# Patient Record
Sex: Male | Born: 1955 | Race: White | Hispanic: No | Smoking: Former smoker
Health system: Southern US, Community
[De-identification: ages and names within clinical notes are randomized; demographics above are authoritative.]

## PROBLEM LIST (undated history)

## (undated) DIAGNOSIS — I2510 Atherosclerotic heart disease of native coronary artery without angina pectoris: Secondary | ICD-10-CM

## (undated) DIAGNOSIS — Z9289 Personal history of other medical treatment: Secondary | ICD-10-CM

## (undated) DIAGNOSIS — R7302 Impaired glucose tolerance (oral): Secondary | ICD-10-CM

## (undated) DIAGNOSIS — E785 Hyperlipidemia, unspecified: Secondary | ICD-10-CM

## (undated) DIAGNOSIS — Z8249 Family history of ischemic heart disease and other diseases of the circulatory system: Secondary | ICD-10-CM

## (undated) HISTORY — DX: Family history of ischemic heart disease and other diseases of the circulatory system: Z82.49

## (undated) HISTORY — PX: OTHER SURGICAL HISTORY: SHX169

## (undated) HISTORY — DX: Atherosclerotic heart disease of native coronary artery without angina pectoris: I25.10

## (undated) HISTORY — PX: PARATHYROIDECTOMY: SHX19

## (undated) HISTORY — DX: Hyperlipidemia, unspecified: E78.5

## (undated) HISTORY — DX: Impaired glucose tolerance (oral): R73.02

## (undated) HISTORY — DX: Personal history of other medical treatment: Z92.89

---

## 1999-12-29 ENCOUNTER — Ambulatory Visit (HOSPITAL_COMMUNITY): Admission: RE | Admit: 1999-12-29 | Discharge: 1999-12-29 | Payer: Self-pay | Admitting: Gastroenterology

## 2000-10-30 ENCOUNTER — Encounter: Payer: Self-pay | Admitting: Neurosurgery

## 2000-10-30 ENCOUNTER — Ambulatory Visit (HOSPITAL_COMMUNITY): Admission: RE | Admit: 2000-10-30 | Discharge: 2000-10-30 | Payer: Self-pay | Admitting: Neurosurgery

## 2001-06-04 HISTORY — PX: TRANSTHORACIC ECHOCARDIOGRAM: SHX275

## 2001-09-02 ENCOUNTER — Encounter: Payer: Self-pay | Admitting: Emergency Medicine

## 2001-09-02 ENCOUNTER — Emergency Department (HOSPITAL_COMMUNITY): Admission: EM | Admit: 2001-09-02 | Discharge: 2001-09-02 | Payer: Self-pay | Admitting: Emergency Medicine

## 2001-10-09 ENCOUNTER — Encounter: Admission: RE | Admit: 2001-10-09 | Discharge: 2001-10-09 | Payer: Self-pay | Admitting: Family Medicine

## 2001-10-09 ENCOUNTER — Encounter: Payer: Self-pay | Admitting: Family Medicine

## 2001-10-31 ENCOUNTER — Ambulatory Visit (HOSPITAL_COMMUNITY): Admission: RE | Admit: 2001-10-31 | Discharge: 2001-10-31 | Payer: Self-pay | Admitting: General Surgery

## 2001-10-31 ENCOUNTER — Encounter: Payer: Self-pay | Admitting: General Surgery

## 2002-01-27 ENCOUNTER — Encounter (INDEPENDENT_AMBULATORY_CARE_PROVIDER_SITE_OTHER): Payer: Self-pay | Admitting: Specialist

## 2002-01-27 ENCOUNTER — Observation Stay (HOSPITAL_COMMUNITY): Admission: RE | Admit: 2002-01-27 | Discharge: 2002-01-28 | Payer: Self-pay | Admitting: General Surgery

## 2004-06-09 ENCOUNTER — Ambulatory Visit: Payer: Self-pay | Admitting: Internal Medicine

## 2004-06-20 ENCOUNTER — Ambulatory Visit: Payer: Self-pay | Admitting: Internal Medicine

## 2004-09-14 ENCOUNTER — Ambulatory Visit (HOSPITAL_COMMUNITY): Admission: RE | Admit: 2004-09-14 | Discharge: 2004-09-14 | Payer: Self-pay | Admitting: Gastroenterology

## 2004-10-02 HISTORY — PX: APPENDECTOMY: SHX54

## 2004-10-23 ENCOUNTER — Encounter (INDEPENDENT_AMBULATORY_CARE_PROVIDER_SITE_OTHER): Payer: Self-pay | Admitting: Specialist

## 2004-10-23 ENCOUNTER — Inpatient Hospital Stay (HOSPITAL_COMMUNITY): Admission: RE | Admit: 2004-10-23 | Discharge: 2004-10-26 | Payer: Self-pay | Admitting: General Surgery

## 2005-03-20 ENCOUNTER — Ambulatory Visit: Payer: Self-pay | Admitting: Internal Medicine

## 2006-04-23 ENCOUNTER — Ambulatory Visit: Payer: Self-pay | Admitting: Internal Medicine

## 2006-05-13 ENCOUNTER — Ambulatory Visit: Payer: Self-pay | Admitting: Internal Medicine

## 2007-05-05 DIAGNOSIS — Z9289 Personal history of other medical treatment: Secondary | ICD-10-CM

## 2007-05-05 HISTORY — DX: Personal history of other medical treatment: Z92.89

## 2007-07-21 ENCOUNTER — Encounter: Admission: RE | Admit: 2007-07-21 | Discharge: 2007-07-21 | Payer: Self-pay | Admitting: Gastroenterology

## 2007-07-24 ENCOUNTER — Ambulatory Visit: Payer: Self-pay | Admitting: Internal Medicine

## 2007-07-24 DIAGNOSIS — J4520 Mild intermittent asthma, uncomplicated: Secondary | ICD-10-CM

## 2007-07-24 DIAGNOSIS — J0100 Acute maxillary sinusitis, unspecified: Secondary | ICD-10-CM

## 2007-07-24 DIAGNOSIS — J069 Acute upper respiratory infection, unspecified: Secondary | ICD-10-CM | POA: Insufficient documentation

## 2008-06-21 ENCOUNTER — Telehealth (INDEPENDENT_AMBULATORY_CARE_PROVIDER_SITE_OTHER): Payer: Self-pay | Admitting: *Deleted

## 2009-03-28 ENCOUNTER — Ambulatory Visit: Payer: Self-pay | Admitting: Internal Medicine

## 2010-08-18 ENCOUNTER — Telehealth (INDEPENDENT_AMBULATORY_CARE_PROVIDER_SITE_OTHER): Payer: Self-pay | Admitting: *Deleted

## 2010-08-22 NOTE — Progress Notes (Signed)
Summary: would like tamiflu called rx sent  Phone Note Call from Patient   Caller: Patient Call For: young Summary of Call: patient phoned stated his 56 year old daughter and she has the flu and they gave tamiflu to his other daughter, and he wants to know if we would call in Tamiflu for him as a precaution. He uses [facility] in [place]. He can be reached at [phone] Initial call taken by: Vedia Coffer,  August 18, 2010 10:31 AM  Follow-up for Phone Call        Called and spoke with pt and he states his daughter has been dx with the flu and she was prescribed tamiflu. Pt states he would like this called in so he can have on hand. Pt states he is not having any symptoms. Also pt has not been seen since 03/2009. Please Advise Dr. Maple Hudson. Thanks.   Allergies  No Known Drug Allergies  Additional Follow-up for Phone Call Additional follow up Details #1::        Per CDY-okay to give Tamilfu 75mg  #10  take 1 by mouth two times a day x 5 days no refills.Reynaldo Minium CMA  August 18, 2010 12:06 PM   called and informed pt rx was sent to pharmacy. he verbalized understanding Carver Fila  August 18, 2010 12:09 PM     New/Updated Medications: TAMIFLU 75 MG CAPS (OSELTAMIVIR PHOSPHATE) take 1 tablet two times a day x 5 days Prescriptions: TAMIFLU 75 MG CAPS (OSELTAMIVIR PHOSPHATE) take 1 tablet two times a day x 5 days  #10 x 0   Entered by:   Carver Fila   Authorized by:   Waymon Budge MD   Signed by:   Carver Fila on 08/18/2010   Method used:   [facility]  [address] [id number]       [address]       [place], [place]  [address]       Ph: [phone] or [phone]       Fax: [phone]   RxID:   [id number]

## 2010-10-20 NOTE — Op Note (Signed)
Craig Townsend, Craig Townsend NO.:  [id number]   MEDICAL RECORD NO.:  [id number]          PATIENT TYPE:  INP   LOCATION:  X007                         FACILITY:  [facility]   PHYSICIAN:  Gita Kudo, M.D. DATE OF BIRTH:  08-Sep-1955   DATE OF PROCEDURE:  10/23/2004  DATE OF DISCHARGE:                                 OPERATIVE REPORT   OPERATIVE PROCEDURE:  1.  Exploratory laparotomy.  2.  Resection of Meckel's diverticulum.  3.  Incidental appendectomy.   ASSISTANT:  Angelia Mould. Derrell Lolling, M.D.   ANESTHESIA:  General endotracheal.   PREOPERATIVE DIAGNOSIS:  Small bowel lipoma.   POSTOPERATIVE DIAGNOSES:  1.  Intussuscepting Meckel's diverticulum.  2.  Normal-appearing thin long appendix.   CLINICAL SUMMARY:  This 55 year old gentleman has had bouts of abdominal  pain.  He had GI work-up and CAT scan at GI work-up and also a CAT scan at  Dr. Ethelene Hal' office.  Dr. Kinnie Scales did a normal colonoscopy.  CAT scan showed a  possible lesion in the small bowel.  A small bowel series showed a probable  ileal lipoma approximately 4 to 5 cm in size.  His symptoms were primarily  crampy type mid abdominal pain near the umbilicus.   OPERATIVE FINDINGS:  The patient had a dimpling of the distal ileum and I  could feel something in the lumen.  We were able to withdraw the  Intussuscepting Meckel's as well as a distal lipoma of the Meckel's.  The  appendix was small.  The entire small bowel was run and there was no other  abnormality.   OPERATIVE PROCEDURE:  Under satisfactory general endotracheal anesthesia,  having received heparin and antibiotics preoperatively, he was positioned,  prepped and draped in the standard fashion.  Nasogastric tube had been  placed.  Midline incision was made with hemostasis intact by cautery.  Self-  retaining retractors were placed and manual and visual laparotomy performed.  The entire small bowel was normal except for the area mentioned above.  The  large bowel also felt normal.  The liver felt normal; although it was not  visualized.   Then, the area of the small bowel was isolated, and with pressure, we  reduced the Meckel's and withdrew it out of the invagination that it had  done and there was a large lipoma at the tip.  It was felt best that we  would just resect the Meckel's and not have to do a formal small bowel  resection.  First, I did an incidental appendectomy.  The long narrow  retrocecal appendix was followed to its tip and then dissected away with  cautery.  At the base, it was secured with a 2-0 silk tie and a 3-0 silk  purse-string placed around it.  It was then clamped, cut, and reduced into  the purse-string and tied.  The area was hemostatic and there was no  spillage.   Then, the Meckel's was elevated.  Traction sutures were placed on either  side of it.  The GIA stapler was then fired across the base in a direction  perpendicular  to the long axis of the bowel to avoid narrowing.  The stapled  closure was then oversewn with interrupted 3-0 silk suture.  The lumen was  not at all compromised.  The specimen was sent to pathology.  The wound  lavaged with saline.  Following this, the wound was closed with a running  midline suture of #1 PDS.  The subcu was infiltrated with 30 cc of Marcaine  for postop analgesia.  Skin edges approximated with staples.  There were no  complications.  The sponge and needle counts were correct.  A sterile  dressing applied and he went to the recovery room.  Blood loss was  negligible.      MRL/MEDQ  D:  10/23/2004  T:  10/23/2004  Job:  [id number]   cc:   Donia Guiles, M.D.  [address] [place]  [place] [address]  Fax: [phone]   Griffith Citron, M.D.  [facility] [place]  [place] [address]  Fax: [phone]   Jamison Neighbor, M.D.  [address]  [place]  [place] [address]  Fax: [phone]   Angelia Mould. Derrell Lolling, M.D.  [address]  [place]  [place] [address]

## 2010-10-20 NOTE — Op Note (Signed)
NAME:  Craig Townsend, Craig Townsend NO.:  [id number]   MEDICAL RECORD NO.:  [id number]                   PATIENT TYPE:   LOCATION:                                       FACILITY:   PHYSICIAN:  Gita Kudo, M.D.              DATE OF BIRTH:   DATE OF PROCEDURE:  01/27/2002  DATE OF DISCHARGE:                                 OPERATIVE REPORT   PREOPERATIVE DIAGNOSES:  Parathyroid adenoma--left lower.   POSTOPERATIVE DIAGNOSES:  Parathyroid adenoma--left lower, confirmed by  frozen section.   OPERATION PERFORMED:  Minimally invasive resection, parathyroid adenoma,  left lower.   SURGEON:  Gita Kudo, M.D.   ASSISTANT:  Velora Heckler, M.D.   ANESTHESIA:  General.   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  This remained elevated and a PTH was  obtained which was elevated and then I saw the patient in ordered a  sestamibi scan.  This was consistent with a left lower parathyroid adenoma.   OPERATIVE FINDINGS:  The patient had a large parathyroid adenoma in the left  lower position.  It was hot on Neoprobe scanning.  After its removal, the  background returned to normal.   DESCRIPTION OF PROCEDURE:  Under satisfactory general endotracheal  anesthesia, the patient was prepped and positioned in the standard fashion.  Preoperative Neoprobe was performed and marking made over the area of hot  nodule in the left lower neck.  Then a transverse incision was made from the  midline to the sternomastoid muscle and platysma incised and small flaps  developed.  The strap muscles were identified at the midline and dissected  and then retracted medially.  The lower pole of the thyroid was evident and  below it there was a rounded mass consistent with a parathyroid adenoma.  This was gently dissected free from the  thyroid and bleeders were divided  after either tying or placing metal clips.  The specimen was then removed  and sent to the pathologist.  In scanning outside the neck, the specimen was  quite hot.  The background of the neck was consistent in all areas.  Following this, the wound was lavaged in saline and closed in layers with 3-  0 and 4-0 Vicryl and Steri-Strips for  skin.  The pathologist called back and frozen section showed that the  specimen was consistent with parathyroid adenoma.  The patient was then  transferred to the recovery room in good condition.  Sponge and needle  counts were correct.  Gita Kudo, M.D.    MRL/MEDQ  D:  01/27/2002  T:  01/29/2002  Job:  [id number]   cc:   Desma Maxim, M.D.

## 2010-10-20 NOTE — Procedures (Signed)
[facility]  Patient:    Craig Townsend, Craig Townsend                      MRN: [id number] Proc. Date: 12/29/99 Adm. Date:  [id number] Disc. Date: [id number] Attending:  Deneen Harts CC:         Desma Maxim, M.D.                           Procedure Report  PROCEDURE:  Colonoscopy.  INDICATION:  55 year old white male undergoing colonoscopy at five year interval for neoplasia surveillance.  History of adenomatous colon polyp initially diagnosed 73.  Repeat examination 1990, 1996.  The patient remains asymptomatic.  DESCRIPTION OF PROCEDURE:  After reviewing the nature of the procedure with the patient including potential risks and complications, and after discussing alternative methods of diagnosis and treatment, informed consent was signed.  The patient was premedicated receiving IV sedation totalling Versed 7.5 mg, Fentanyl 75 mcg administered IV in divided doses prior to and during the course of the procedure.  Using an Olympus PCF-140L pediatric video colonoscope, rectum was intubated after normal digital examination.  The scope was inserted and advanced without difficulty around the entire length of the colon to the cecum which was identified by the appendiceal orifice and ileocecal valve.  Excellent preparation throughout, patient having taken a combination Visicol/Fleets Phospho-Soda preparation.  The scope was slowly withdrawn with careful inspection of the entire colon in a retrograde manner including retroflex view in the rectal vault.  No abnormality was noted.  There was no evidence of neoplasia, mucosal inflammation, diverticular disease, or vascular lesion. Retroflex view in the vault revealed hypertrophy at anal papilla but no additional abnormality appreciated.  The colon was decompressed and scope withdrawn.  The patient tolerated the procedure without difficulty being maintained on Datascope monitor and low-flow oxygen throughout.   Returned to recovery in stable condition.  Time 1, technical 1, preparation 1, total score = 3.  ASSESSMENT:  Normal colonoscopy without evidence of recurrent colorectal neoplasia.  RECOMMENDATIONS: 1. Annual Hemoccults per Dr. Arvilla Market. 2. Repeat colonoscopy five years - per Dr. Kinnie Scales. DD:  12/29/99 TD:  01/01/00 Job: [id number]

## 2010-10-20 NOTE — Assessment & Plan Note (Signed)
[facility]                               PULMONARY OFFICE NOTE   ELNATHAN, FULFORD                      MRN:          [id number]  DATE:04/23/2006                            DOB:          May 07, 1956    PULMONARY FOLLOWUP   PROBLEM:  1. Asthma.  2. Allergic rhinitis.   HISTORY:  He says this has been a good year but, 10 days ago, his daughter  brought home a cold.  He has begun sore throat and a progressive chest  congestion.  He saw a physician assistant at Dr. Roselie Skinner office and was  given [employer].  Now nasal discharge is green.  He is noticing  paroxysmal cough and some wheeze, frontal and vertex headache.  No fever.  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  When he was last seen a year ago, we had  given him amoxicillin and he says that worked well.  A chest x-ray at that  visit showed no evidence of chest disease.  I talked with him today about  viral versus bacterial infection, supportive care of simple infections,  realistic expectations.  He is convinced that he is a little different and  that left alone he will rapidly get worse.   MEDICATIONS:  P.R.N. use of Allegra 60 mg, Celebrex, and albuterol inhaler  with no medication allergy.   OBJECTIVE:  Weight 180 pounds, BP 132/84, pulse regular 85, room air  saturation 98%.  A very red throat, no adenopathy, marked nasal congestion with a little air  flow through it.  There is no stridor.  His chest sounds quiet and clear, except that with a deep breath, he began  coughing, nonproductively.   IMPRESSION:  Acute respiratory syndrome consistent with a viral upper  respiratory infection and some tracheitis.   PLAN:  1. We discussed supportive care, fluids, rest, and symptomatic therapy.  2. Refill prescriptions albuterol and Allegra 60 mg.  3. Phenergan with codeine 5 mL q. 6 h. p.r.n. occasional use.  4.  After discussion, I gave a prescription to hold for amoxicillin 500 mg      t.i.d. x7 days for the holidays while the office will be closed.  5. Schedule return in 1 year, earlier p.r.n.    Clinton D. Maple Hudson, MD, Tonny Bollman, FACP  Electronically Signed   CDY/MedQ  DD: 04/24/2006  DT: 04/25/2006  Job #: [id number]

## 2010-10-20 NOTE — Assessment & Plan Note (Signed)
[facility]                             PULMONARY OFFICE NOTE   ELMAR, ANTIGUA                      MRN:          [id number]  DATE:05/13/2006                            DOB:          [birth year]    HISTORY:  The patient is a 55 year old white male, a patient of Dr.  Joni Fears D[facility] who has a known history of asthma and allergic  rhinitis.  He presents for an acute office visit.  The patient complains  of a three-week history of persistent nasal congestion, sinus pain and  pressure and cough.  The patient was seen approximately three weeks ago  for similar symptoms and given amoxicillin x7 days.  The patient reports  symptoms only minimally improved, and continues to have thick green  nasal discharge and sinus pain and pressure.  The patient denies any  hemoptysis, chest pain or shortness of breath.   PAST MEDICAL HISTORY:  Is reviewed.   CURRENT MEDICATIONS:  Are reviewed.   PHYSICAL EXAMINATION:  GENERAL:  The patient is a pleasant white male,  in no acute distress.  VITAL SIGNS:  Afebrile with stable vital signs.  HEENT:  Nasal mucosa erythematous.  Maxillary sinus tenderness to  percussion.  Posterior pharynx is clear.  NECK:  Supple.  LUNGS:  Sounds clear.  HEART:  A regular rate.  ABDOMEN:  Soft, benign.  EXTREMITIES:  Warm without any edema.   IMPRESSION/PLAN:  Acute sinusitis:  The patient is to begin Omnicef x10  days.  Mucinex DM  b.i.d.  Nasal __________  daily and Afrin nasal x5  days.  The patient is to return back with Dr. Maple Hudson as scheduled, or  sooner if needed.      Rubye Oaks, NP  Electronically Signed      Clinton D. Maple Hudson, MD, Tonny Bollman, FACP  Electronically Signed   TP/MedQ  DD: 05/13/2006  DT: 05/14/2006  Job #: [id number]

## 2010-10-20 NOTE — Discharge Summary (Signed)
NAMEDESHUN, SEDIVY NO.:  [id number]   MEDICAL RECORD NO.:  [id number]          PATIENT TYPE:  INP   LOCATION:  1511                         FACILITY:  [facility]   PHYSICIAN:  Gita Kudo, M.D. DATE OF BIRTH:  02-Nov-1955   DATE OF ADMISSION:  10/23/2004  DATE OF DISCHARGE:  10/26/2004                                 DISCHARGE SUMMARY   CHIEF COMPLAINT:  Abdominal pain, small bowel lesion.   HISTORY OF PRESENT ILLNESS:  This 55 year old male was admitted for elective  surgery. He has had crampy abdominal pain and workup shows a small bowel  lesion. He has had a previous parathyroid adenoma excised by myself.   LABORATORY STUDIES:  Pathology:  Meckel's diverticulum with inflammation and  ulceration. Appendix with fibrous obliteration. Hemoglobin 15, hematocrit  45, white count 5700. CMET normal except for random elevated sugar of 112.  Bilirubin minimally elevated at 1.3. EKG was interpreted as normal.   HOSPITAL COURSE:  On the morning of admission the patient underwent  laparotomy with resection of a Meckel's diverticulum and incidental  appendectomy. Of note is the fact that the Meckel's had actually  intussuscepted.   Postoperatively he had a benign course. His nasogastric tube was removed, he  was tolerating liquids, and he improved to the point where he was discharged  from the hospital on his third postoperative day.   There were no complications. No consultations.   CONDITION ON DISCHARGE:  Good.   FINAL DIAGNOSIS:  Meckel's diverticulum and incidental appendectomy.       MRL/MEDQ  D:  11/10/2004  T:  11/10/2004  Job:  [id number]   cc:   Griffith Citron, M.D.  [facility] [place]  [place] [address]  Fax: [phone]   Donia Guiles, M.D.  [address] [place]  [place] [address]  Fax: [phone]   Jamison Neighbor, M.D.  [address]  [place]  [place] [address]  Fax: [phone]

## 2010-11-13 ENCOUNTER — Encounter: Payer: Self-pay | Admitting: Internal Medicine

## 2010-11-14 ENCOUNTER — Other Ambulatory Visit (INDEPENDENT_AMBULATORY_CARE_PROVIDER_SITE_OTHER): Payer: 59

## 2010-11-14 ENCOUNTER — Encounter: Payer: Self-pay | Admitting: Internal Medicine

## 2010-11-14 ENCOUNTER — Ambulatory Visit (INDEPENDENT_AMBULATORY_CARE_PROVIDER_SITE_OTHER): Payer: 59 | Admitting: Internal Medicine

## 2010-11-14 VITALS — BP 124/78 | HR 77 | Ht 71.0 in | Wt 190.4 lb

## 2010-11-14 DIAGNOSIS — J019 Acute sinusitis, unspecified: Secondary | ICD-10-CM

## 2010-11-14 DIAGNOSIS — J45909 Unspecified asthma, uncomplicated: Secondary | ICD-10-CM

## 2010-11-14 LAB — CBC WITH DIFFERENTIAL/PLATELET
Eosinophils Relative: 2.5 % (ref 0.0–5.0)
HCT: 43.7 % (ref 39.0–52.0)
Lymphs Abs: 1.8 10*3/uL (ref 0.7–4.0)
MCV: 91.4 fl (ref 78.0–100.0)
Monocytes Absolute: 0.4 10*3/uL (ref 0.1–1.0)
Neutro Abs: 4.1 10*3/uL (ref 1.4–7.7)
Platelets: 197 10*3/uL (ref 150.0–400.0)
WBC: 6.5 10*3/uL (ref 4.5–10.5)

## 2010-11-14 LAB — SEDIMENTATION RATE: Sed Rate: 7 mm/hr (ref 0–22)

## 2010-11-14 MED ORDER — METHYLPREDNISOLONE ACETATE 80 MG/ML IJ SUSP
80.0000 mg | Freq: Once | INTRAMUSCULAR | Status: AC
  Administered 2010-11-14: 80 mg via INTRAMUSCULAR

## 2010-11-14 NOTE — Assessment & Plan Note (Addendum)
I think an allergic rhinitis is more likely than overt  sinusitis now. There may be additional basis for morning headache.

## 2010-11-14 NOTE — Assessment & Plan Note (Signed)
Not clear by history  But he seems to have had very mild exacerbation of asthma and rhintis We will give depo x 1 and send lab addressing his questions about allergy and general malaise. Reassurance.

## 2010-11-14 NOTE — Patient Instructions (Addendum)
Depo 80  Lab-  CBC w/ diff, Sed rate, TSH, Allergy profile        Dx asthma, sinusitis

## 2010-11-14 NOTE — Progress Notes (Signed)
  Subjective:    Patient ID: Craig Townsend, male    DOB: 05/13/1956, 55 y.o.   MRN: [id number]  HPI 11/14/10- 73 yoM never smoker, followed for rhinitis, hx asthma, URI and sinusitis. Last here 03/28/09- Note reviewed. Has avoided colds better in last few years- still has 67 and 61 yo children bringing colds home.  In last 2 months is waking more with headache and wants to see if if is allergy.  Vaguely anterior headaches, more often early morning. Some dizziness. Ears ok. Nose ok. Throat gets uncomfortable, not really sore. Denies fever, sweat, swollen glands. Chest ok and denies rest of ROS. Some good days.  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 Sleeps better some days than others.   Review of Systems Constitutional:   No weight loss, night sweats,  Fevers, chills, fatigue, lassitude. HEENT:   No ,  Difficulty swallowing,  Tooth/dental problems,  Sore throat,                No sneezing, itching,    CV:  No chest pain,  Orthopnea, PND, swelling in lower extremities, anasarca, dizziness, palpitations  GI  No heartburn, indigestion, abdominal pain, nausea, vomiting, diarrhea, change in bowel habits, loss of appetite  Resp: No shortness of breath with exertion or at rest.  No excess mucus, no productive cough,  No non-productive cough,  No coughing up of blood.  No change in color of mucus.  No wheezing.  Skin: no rash or lesions.  GU: no dysuria, change in color of urine, no urgency or frequency.  No flank pain.  MS:  No joint pain or swelling.  No decreased range of motion.  No back pain.  Psych:  No change in mood or affect. No depression or anxiety.  No memory loss.      Objective:   Physical Exam General- Alert, Oriented, Affect-appropriate, Distress- none acute  Skin- rash-none, lesions- none, excoriation- none  Lymphadenopathy- none  Head- atraumatic  Eyes- Gross vision intact, PERRLA, conjunctivae clear secretions  Ears- Hearing,  canals, Tm- normal  Nose- Clear, No- Septal dev, mucus, polyps, erosion, perforation   Throat- Mallampati II , mucosa clear , drainage- none, tonsils- atrophic  Neck- flexible , trachea midline, no stridor , thyroid nl, carotid no bruit  Chest - symmetrical excursion , unlabored     Heart/CV- RRR , no murmur , no gallop  , no rub, nl s1 s2                     - JVD- none , edema- none, stasis changes- none, varices- none     Lung- clear to P&A, wheeze- none, cough- none , dullness-none, rub- none     Chest wall-   Abd- tender-no, distended-no, bowel sounds-present, HSM- no  Br/ Gen/ Rectal- Not done, not indicated  Extrem- cyanosis- none, clubbing, none, atrophy- none, strength- nl  Neuro- grossly intact to observation        Assessment & Plan:

## 2010-11-15 LAB — ALLERGY FULL PROFILE
Allergen,Goose feathers, e70: <0.35 kU/L (ref <0.35)
Alternaria Alternata: <0.35 kU/L (ref <0.35)
Bermuda Grass: <0.35 kU/L (ref <0.35)
Candida Albicans: <0.35 kU/L (ref <0.35)
Cat Dander: <0.35 kU/L (ref <0.35)
Curvularia lunata: <0.35 kU/L (ref <0.35)
Dog Dander: <0.35 kU/L (ref <0.35)
Fescue: <0.35 kU/L (ref <0.35)
G009 Red Top: <0.35 kU/L (ref <0.35)
Goldenrod: <0.35 kU/L (ref <0.35)
Helminthosporium halodes: <0.35 kU/L (ref <0.35)
IgE (Immunoglobulin E), Serum: 6.7 IU/mL (ref 0.0–180.0)
Lamb's Quarters: <0.35 kU/L (ref <0.35)
Plantain: <0.35 kU/L (ref <0.35)
Stemphylium Botryosum: <0.35 kU/L (ref <0.35)

## 2010-11-18 ENCOUNTER — Encounter: Payer: Self-pay | Admitting: Internal Medicine

## 2010-11-27 NOTE — Progress Notes (Signed)
Quick Note:  Spoke with patient-aware of results. ______ 

## 2011-06-14 ENCOUNTER — Other Ambulatory Visit: Payer: Self-pay | Admitting: Urology

## 2011-06-14 DIAGNOSIS — R109 Unspecified abdominal pain: Secondary | ICD-10-CM

## 2011-06-15 ENCOUNTER — Other Ambulatory Visit: Payer: 59

## 2011-06-19 ENCOUNTER — Ambulatory Visit
Admission: RE | Admit: 2011-06-19 | Discharge: 2011-06-19 | Disposition: A | Discharge status: Home / Self Care | Payer: 59 | Source: Ambulatory Visit | Attending: Urology | Admitting: Urology

## 2011-06-19 DIAGNOSIS — R109 Unspecified abdominal pain: Secondary | ICD-10-CM

## 2012-05-11 IMAGING — US US RENAL
1 series · 14 of 25 positions shown · non-contrast
Comparison: 07/21/2007

CLINICAL DATA: Left flank pain.

RENAL/URINARY TRACT ULTRASOUND COMPLETE

[Series 1: us renal · 0.22mm/px · 14 of 30 slices shown]
[im 1/30]
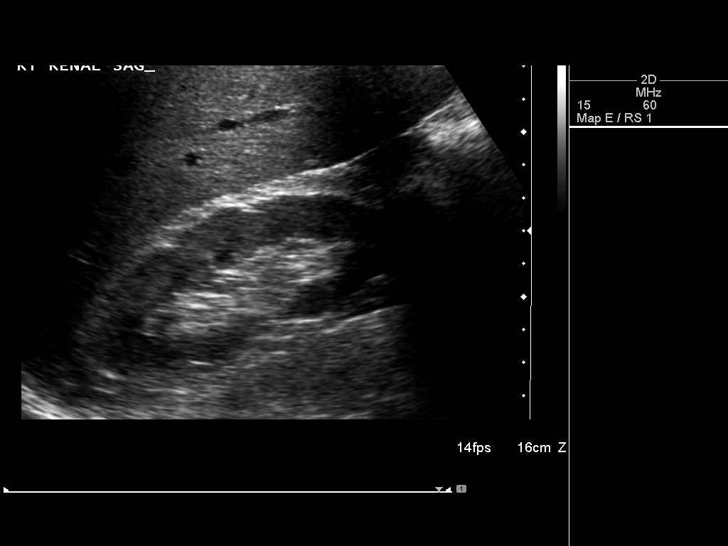
[im 3/30]
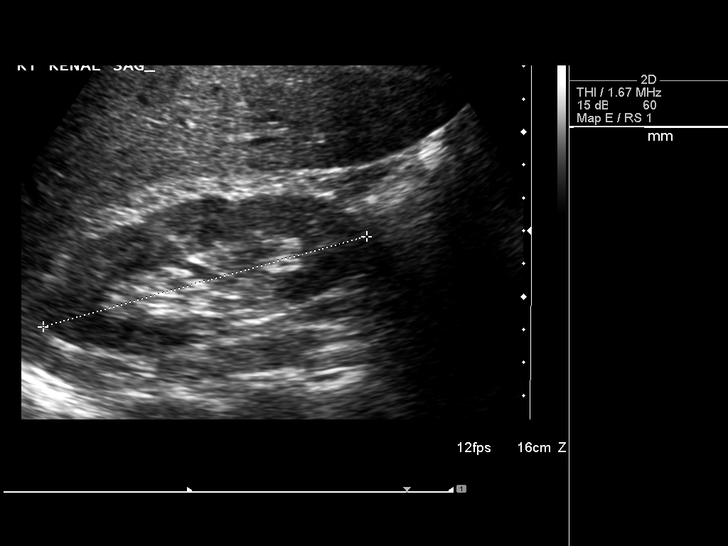
[im 5/30]
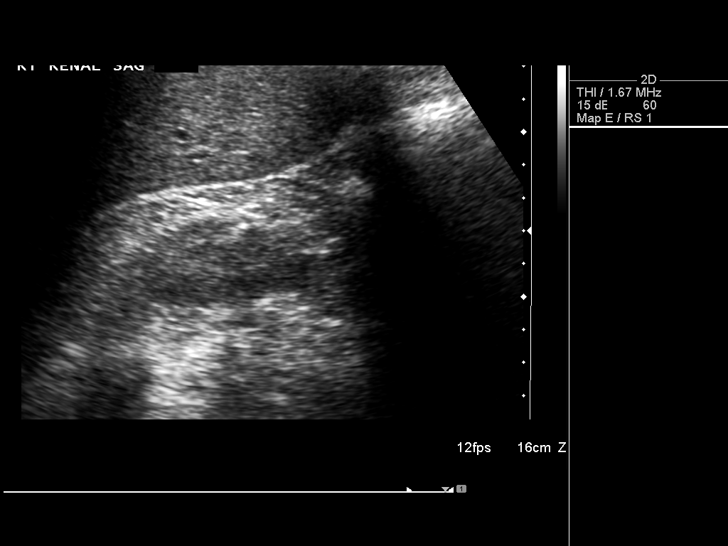
[im 8/30]
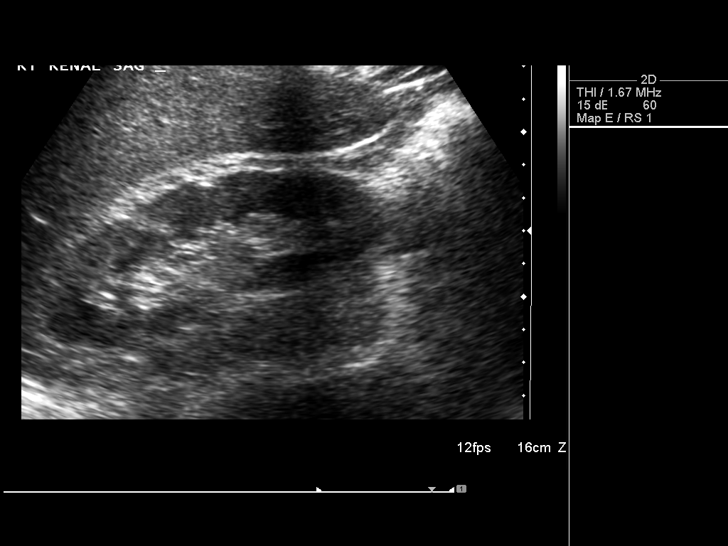
[im 10/30]
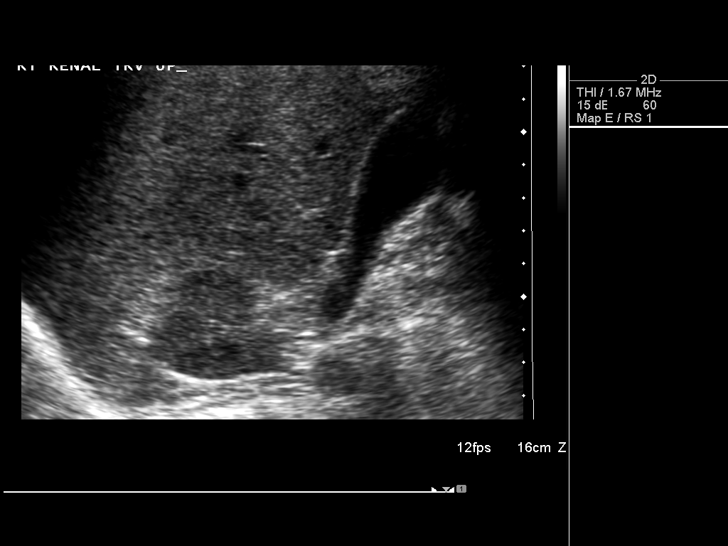
[im 11/30]
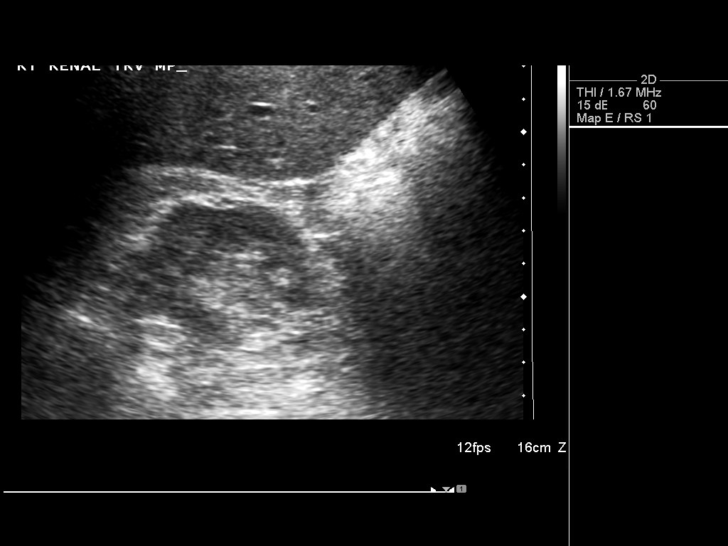
[im 14/30]
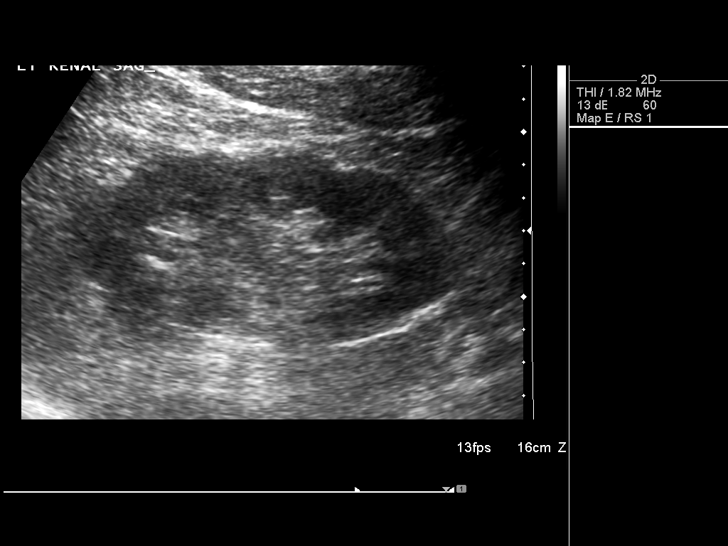
[im 16/30]
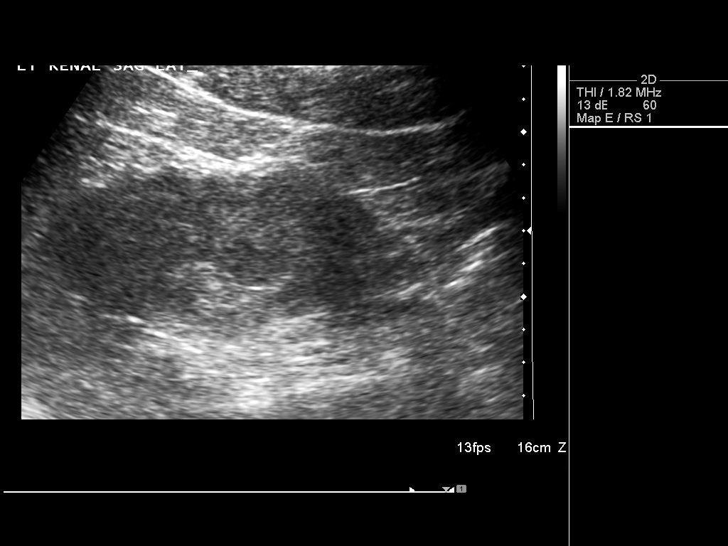
[im 19/30]
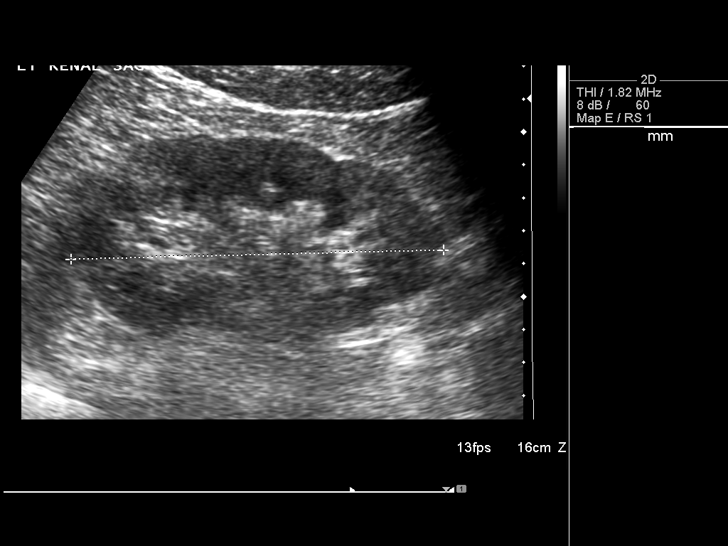
[im 20/30]
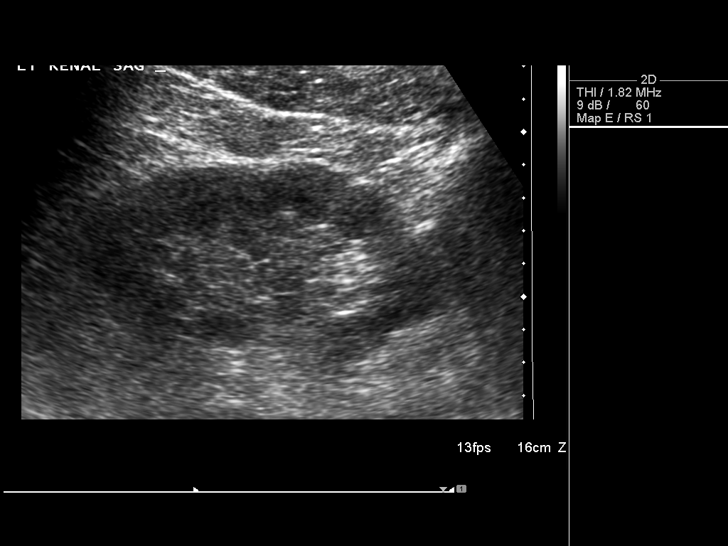
[im 22/30]
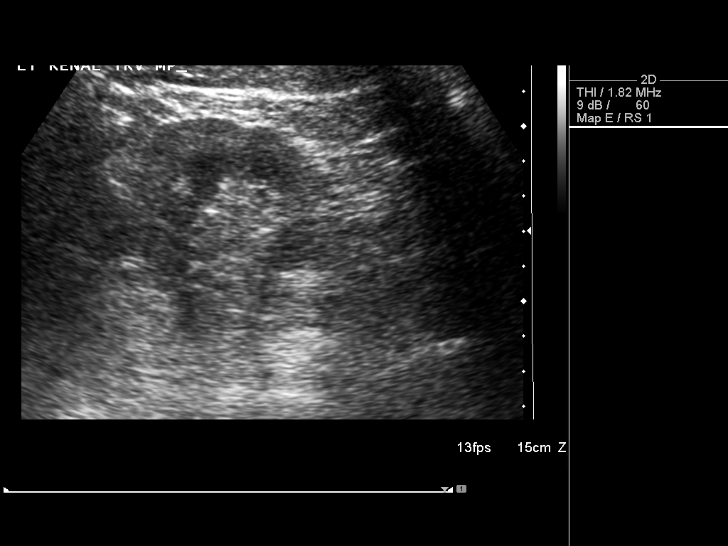
[im 25/30]
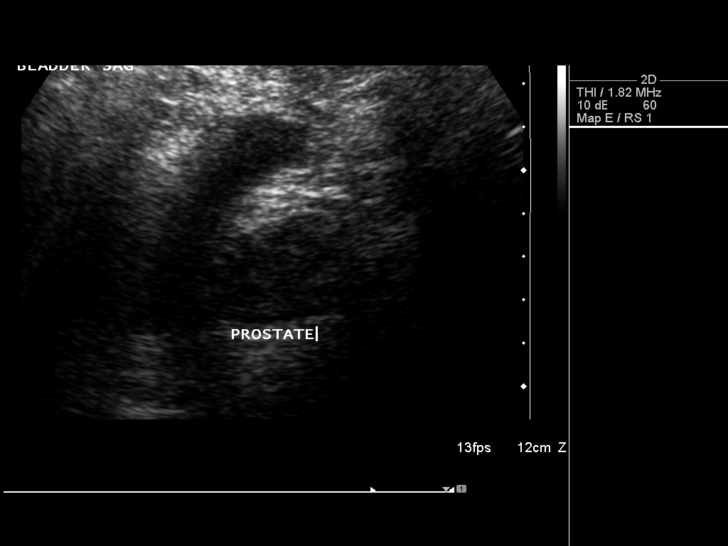
[im 27/30]
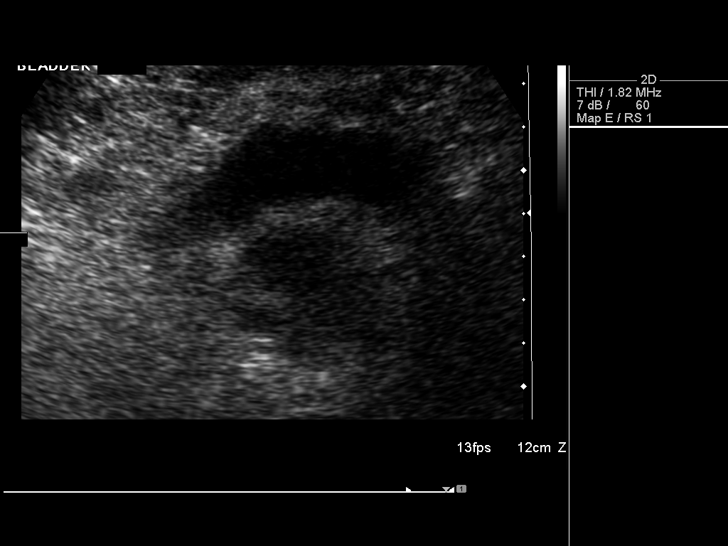
[im 30/30]
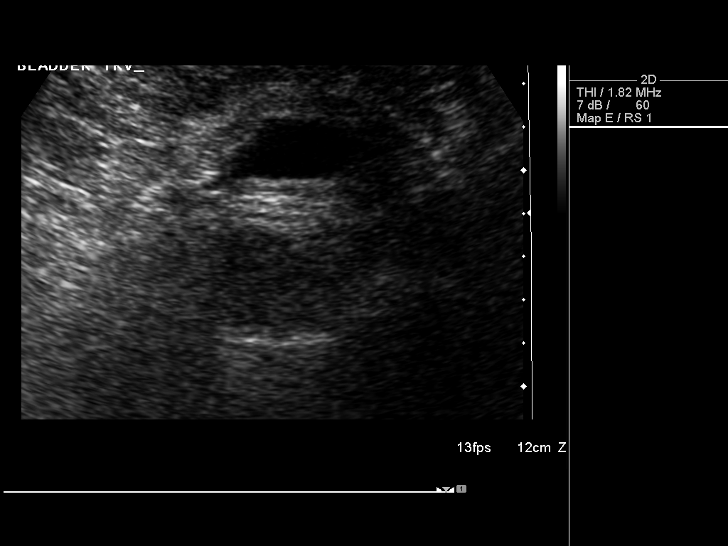

[14 of 25 positions shown; findings below may reference images not displayed]

FINDINGS: Right Kidney:  11.1 cm in length.  Normal.

Left Kidney:  11.6 cm in length.  Normal.

Bladder:  Normal. Prostate gland measures 3.1 x 2.6 x 3.9 cm.
IMPRESSION: Normal appearing kidneys.

## 2013-02-09 ENCOUNTER — Other Ambulatory Visit: Payer: Self-pay | Admitting: Dermatology

## 2013-02-13 ENCOUNTER — Encounter: Payer: Self-pay | Admitting: Internal Medicine

## 2013-02-13 ENCOUNTER — Ambulatory Visit (INDEPENDENT_AMBULATORY_CARE_PROVIDER_SITE_OTHER): Payer: 59 | Admitting: Internal Medicine

## 2013-02-13 VITALS — BP 138/82 | HR 88 | Ht 71.0 in | Wt 186.2 lb

## 2013-02-13 DIAGNOSIS — J069 Acute upper respiratory infection, unspecified: Secondary | ICD-10-CM

## 2013-02-13 DIAGNOSIS — R05 Cough: Secondary | ICD-10-CM

## 2013-02-13 DIAGNOSIS — Z23 Encounter for immunization: Secondary | ICD-10-CM

## 2013-02-13 MED ORDER — PROMETHAZINE-CODEINE 6.25-10 MG/5ML PO SYRP: 5.0000 mL | ORAL_SOLUTION | ORAL | Status: DC | PRN

## 2013-02-13 MED ORDER — AZITHROMYCIN 250 MG PO TABS: ORAL_TABLET | ORAL | Status: DC

## 2013-02-13 MED ORDER — LEVALBUTEROL HCL 0.63 MG/3ML IN NEBU
0.6300 mg | INHALATION_SOLUTION | Freq: Once | RESPIRATORY_TRACT | Status: AC
  Administered 2013-02-13: 0.63 mg via RESPIRATORY_TRACT

## 2013-02-13 NOTE — Patient Instructions (Addendum)
Neb xop 0.63  Flu vax  Script for Z pak antibiotic  Script for cough syrup  Please call as needed

## 2013-02-13 NOTE — Progress Notes (Signed)
  HPI  11/14/10- 54 yoM never smoker, followed for rhinitis, hx asthma, URI and sinusitis.  Last here 03/28/09- Note reviewed.  Has avoided colds better in last few years- still has 19 and 58 yo children bringing colds home.  In last 2 months is waking more with headache and wants to see if if is allergy.  Vaguely anterior headaches, more often early morning. Some dizziness. Ears ok. Nose ok. Throat gets uncomfortable, not really sore. Denies fever, sweat, swollen glands. Chest ok and denies rest of ROS. Some good days.  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  Sleeps better some days than others.   02/13/13- 56 yoM never smoker with history of rhinitis, mild intermittent asthma, acute sinusitis. FOLLOWS FOR:  Sinus pressure, PND and sore throat w/ cough on and off non-productive Recover cold September 10 has had persistent cough with thick mucus, retro-orbital headache. Sore throat is getting better. No fever.  ROS-see HPI Constitutional:   No-   weight loss, night sweats, fevers, chills, fatigue, lassitude. HEENT:   No-  headaches, difficulty swallowing, tooth/dental problems, +sore throat,       No-  sneezing, itching, ear ache, +nasal congestion, post nasal drip,  CV:  No-   chest pain, orthopnea, PND, swelling in lower extremities, anasarca, dizziness, palpitations Resp: No-   shortness of breath with exertion or at rest.              +  productive cough,  No non-productive cough,  No- coughing up of blood.              No-   change in color of mucus.  No- wheezing.   Skin: No-   rash or lesions. GI:  No-   heartburn, indigestion, abdominal pain, nausea, vomiting,  GU: . MS:  No-   joint pain or swelling.   Neuro-     nothing unusual Psych:  No- change in mood or affect. No depression or anxiety.  No memory loss.  OBJ- Physical Exam General- Alert, Oriented, Affect-appropriate, Distress- none acute Skin- rash-none, lesions- none, excoriation-  none Lymphadenopathy- none Head- atraumatic            Eyes- Gross vision intact, PERRLA, conjunctivae and secretions clear            Ears- Hearing, canals-normal            Nose- +mucus bridging, no-Septal dev, polyps, erosion, perforation             Throat- Mallampati III , mucosa clear , drainage- none, tonsils- atrophic Neck- flexible , trachea midline, no stridor , thyroid nl, carotid no bruit Chest - symmetrical excursion , unlabored           Heart/CV- RRR , no murmur , no gallop  , no rub, nl s1 s2                           - JVD- none , edema- none, stasis changes- none, varices- none           Lung- clear to P&A, wheeze- none, cough- none , dullness-none, rub- none           Chest wall-  Abd-  Br/ Gen/ Rectal- Not done, not indicated Extrem- cyanosis- none, clubbing, none, atrophy- none, strength- nl Neuro- grossly intact to observation

## 2013-02-22 NOTE — Assessment & Plan Note (Signed)
Recent upper respiratory infection, more likely viral than a seasonal ragweed rhinitis. He has had a tendency to progress to bacterial sinusitis. Plan-nebulizer nasal decongestant, Z-Pak, hydration. Flu vaccine

## 2013-02-26 ENCOUNTER — Ambulatory Visit (INDEPENDENT_AMBULATORY_CARE_PROVIDER_SITE_OTHER): Payer: 59 | Admitting: Surgery

## 2013-02-26 ENCOUNTER — Encounter (INDEPENDENT_AMBULATORY_CARE_PROVIDER_SITE_OTHER): Payer: Self-pay | Admitting: Surgery

## 2013-02-26 VITALS — BP 150/82 | HR 80 | Temp 98.4°F | Resp 14 | Ht 71.0 in | Wt 180.2 lb

## 2013-02-26 DIAGNOSIS — K4090 Unilateral inguinal hernia, without obstruction or gangrene, not specified as recurrent: Secondary | ICD-10-CM | POA: Insufficient documentation

## 2013-02-26 NOTE — Progress Notes (Signed)
Re:   Craig Townsend DOB:   April 17, 1956 MRN:   [id number]  ASSESSMENT AND PLAN: 1.  Right inguinal hernia  I discussed the indications and complications of hernia surgery with the patient.  I discussed both the laparoscopic and open approach to hernia repair.  Because of his prior midline incision, he is not a candidate for laparoscopic hernia repair.  The potential risks of hernia surgery include, but are not limited to, bleeding, infection, open surgery, nerve injury, and recurrence of the hernia.  I provided the patient literature about hernia surgery.  The patient has yet to hear from his job as to what they require.  The hernia is asymptomatic.  I think it can be the patient's choice whether he wants to get the hernia fixed or not.  If the hernia gets larger or becomes symptomatic, I would fix it.  But if it is stable, he can watch it.  He needs no limit on his physical activity.  At this time, he wants to watch it.  He will see what his work says.  He does not want an appt back with me to recheck the hernia - which is fine.  Return is PRN.  2.  Asthma  Has seen Dr. Melba Coon 3.  Hyperlipidemia  Chief Complaint  Patient presents with  . New Evaluation    eval RIH   REFERRING PHYSICIAN:   Dr. Ellis Parents  HISTORY OF PRESENT ILLNESS: Craig Townsend is a 57 y.o. (DOB: June 27, 1955)  white  male whose primary care physician is Benita Stabile, MD and comes to me today for right inguinal hernia.  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 by Dr. Ellis Parents, who found a right inguinal hernia.  He may have noticed something in his right groin before, but he is not sure.  On his physical exam the year before (2013), he said the physician did not examine his groin.  He has had no prior hernia surgery.  He did have an exploratory laparotomy in 2006 by Dr. Maryagnes Amos for SBO.    He has no history of stomach, liver, pancreas,  or colon disease.   Past Medical History  Diagnosis Date  . Asthma   . Acute sinusitis   . Hyperlipidemia      Past Surgical History  Procedure Laterality Date  . Parathyroidectomy    . Suprapubic incision      ? volvulus   . Appendectomy       Current Outpatient Prescriptions  Medication Sig Dispense Refill  . aspirin 81 MG tablet Take 81 mg by mouth daily.        . rosuvastatin (CRESTOR) 5 MG tablet Take 5 mg by mouth daily.        Marland Kitchen acyclovir (ZOVIRAX) 800 MG tablet       . fluocinonide cream (LIDEX) 0.05 %       . promethazine-codeine (PHENERGAN WITH CODEINE) 6.25-10 MG/5ML syrup Take 5 mLs by mouth every 4 (four) hours as needed for cough. 1 tsp four times a day as needed cough  180 mL  0   No current facility-administered medications for this visit.     No Known Allergies  REVIEW OF SYSTEMS: Skin:  No history of rash.  No history of abnormal moles. Infection:  No history of hepatitis or HIV.  No history of MRSA. Neurologic:  No history of stroke.  No  history of seizure.  No history of headaches. Cardiac:  No history of hypertension. No history of heart disease.    Sees Dr. Rennis Golden, more for preventative measures that for known cardiac problems.   Part of his reasoning is that he is an older father and he wanted to look after himself. Pulmonary:  Has seen Dr. Melba Coon for asthma/rhinitis.  Endocrine:  No diabetes. No thyroid disease.  History of parathyroid excision by Dr. Maryagnes Amos - 2003. Gastrointestinal:  History of abdominal exploration for SBO by Dr. Maryagnes Amos - 2006. Urologic:  No history of kidney stones.  No history of bladder infections. Sees Dr. Andrez Grime. Musculoskeletal:  No history of joint or back disease. Hematologic:  No bleeding disorder.  No history of anemia.  Not anticoagulated. Psycho-social:  The patient is oriented.   The patient has no obvious psychologic or social impairment to understanding our conversation and plan.  SOCIAL and FAMILY  HISTORY: Divorced. 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 Has 2 children - 12 and 15 who spend 1/2 the time with him.  PHYSICAL EXAM: BP 150/82  Pulse 80  Temp(Src) 98.4 F (36.9 C) (Temporal)  Resp 14  Ht 5\' 11"  (1.803 m)  Wt 180 lb 3.2 oz (81.738 kg)  BMI 25.14 kg/m2  General: WN WM who is alert and generally healthy appearing.  HEENT: Normal. Pupils equal. Neck: Supple. No mass.  No thyroid mass.  Scar at left base of neck. Lymph Nodes:  No supraclavicular or cervical nodes. Lungs: Clear to auscultation and symmetric breath sounds. Heart:  RRR. No murmur or rub.  Abdomen: Soft. No mass. No tenderness. No hernia. Normal bowel sounds.  Well healed mid line incision.  Small reducible right inguinal hernia.  I do not feel a left inguinal defect. Rectal: Not done. Extremities:  Good strength and ROM  in upper and lower extremities. Neurologic:  Grossly intact to motor and sensory function. Psychiatric: Has normal mood and affect. Behavior is normal.   DATA REVIEWED: Notes in Epic.  Ovidio Kin, MD,  [facility] [address] [place]  [address]   [place], [place]    [address] Phone:  [phone] FAX:  [phone]

## 2013-02-27 ENCOUNTER — Encounter (INDEPENDENT_AMBULATORY_CARE_PROVIDER_SITE_OTHER): Payer: Self-pay | Admitting: Surgery

## 2013-02-27 ENCOUNTER — Telehealth (INDEPENDENT_AMBULATORY_CARE_PROVIDER_SITE_OTHER): Payer: Self-pay

## 2013-02-27 NOTE — Telephone Encounter (Signed)
V/M Letter for work ready for P/U

## 2013-03-09 ENCOUNTER — Ambulatory Visit (INDEPENDENT_AMBULATORY_CARE_PROVIDER_SITE_OTHER): Payer: 59 | Admitting: Surgery

## 2013-05-25 ENCOUNTER — Encounter: Payer: Self-pay | Admitting: *Deleted

## 2013-06-01 ENCOUNTER — Ambulatory Visit: Payer: 59 | Admitting: Internal Medicine

## 2013-06-01 ENCOUNTER — Encounter: Payer: Self-pay | Admitting: Internal Medicine

## 2013-06-03 ENCOUNTER — Encounter: Payer: Self-pay | Admitting: Internal Medicine

## 2013-06-03 ENCOUNTER — Ambulatory Visit (INDEPENDENT_AMBULATORY_CARE_PROVIDER_SITE_OTHER): Payer: 59 | Admitting: Internal Medicine

## 2013-06-03 VITALS — BP 138/82 | HR 72 | Ht 71.0 in | Wt 187.6 lb

## 2013-06-03 DIAGNOSIS — R7301 Impaired fasting glucose: Secondary | ICD-10-CM | POA: Insufficient documentation

## 2013-06-03 DIAGNOSIS — E785 Hyperlipidemia, unspecified: Secondary | ICD-10-CM

## 2013-06-03 DIAGNOSIS — Z8249 Family history of ischemic heart disease and other diseases of the circulatory system: Secondary | ICD-10-CM | POA: Insufficient documentation

## 2013-06-03 MED ORDER — ROSUVASTATIN CALCIUM 5 MG PO TABS: 5.0000 mg | ORAL_TABLET | Freq: Every day | ORAL | Status: DC

## 2013-06-03 NOTE — Patient Instructions (Signed)
Have FASTING lab work done at [facility] one morning next week.  Refills on your Crestor has been sent to your pharmacy electronically.  Dr. Rennis Golden recommends that you schedule a follow-up appointment in: ONE YEAR.

## 2013-06-03 NOTE — Progress Notes (Signed)
OFFICE NOTE  Chief Complaint:  Right inguinal hernia, otherwise no complaints  Primary Care Physician: Craig Carney, MD  HPI:  Craig Townsend  is a 57 year old gentleman here for an annual check-up. He has a history of dyslipidemia and mild coronary disease. We have been medically managing him, and he had actually a fairly good NMR profile last year with a particle number of 1080. LDL was 52 on Crestor 5 at bedtime. Unfortunately, he has impaired fasting glucose and with a two-hour glucose tolerance test, the glucose peaked at 196, down to 141. He has not yet required medications for that. Overall the past year, he has had no worsening shortness of breath, chest pain, palpitations, presyncope, or syncopal symptoms. He has been exercising and reported that his fasting glucose is now down to 90. He continues to take Crestor without any difficulty and is due for a lipid profile.  Also of note he was recently diagnosed with a right inguinal hernia which is considered small and he was not advised to undergo surgical repair.  PMHx:  Past Medical History  Diagnosis Date  . Asthma   . Acute sinusitis   . Hyperlipidemia   . History of nuclear stress test 05/2007    bruce myoview; normal pattern of perfusion in all regions, post-stress EF 74%, normal low risk study   . Dyslipidemia   . CAD (coronary artery disease)     mild  . Impaired glucose tolerance   . Family history of heart disease     Past Surgical History  Procedure Laterality Date  . Parathyroidectomy    . Suprapubic incision      ? volvulus   . Appendectomy  10/2004    exploratory lap, resection of Meckel's diverticulum (Dr. Biagio Townsend)   . Transthoracic echocardiogram  2003    EF 60%, trivial TR    FAMHx:  Family History  Problem Relation Age of Onset  . Asthma Father     deceased at age 67  . Heart disease Father 59  . Kidney disease Sister     SOCHx:   reports that he quit smoking about 15 years ago. He has  never used smokeless tobacco. He reports that he drinks alcohol. He reports that he does not use illicit drugs.  ALLERGIES:  No Known Allergies  ROS: A comprehensive review of systems was negative except for: Musculoskeletal: positive for right inguinal hernia  HOME MEDS: Current Outpatient Prescriptions  Medication Sig Dispense Refill  . acyclovir (ZOVIRAX) 800 MG tablet as needed.       Marland Kitchen aspirin 81 MG tablet Take 81 mg by mouth daily.        . cetirizine (ZYRTEC) 10 MG tablet Take 10 mg by mouth daily as needed for allergies.      Marland Kitchen nystatin-triamcinolone (MYCOLOG II) cream Apply 1 application topically as needed.      . rosuvastatin (CRESTOR) 5 MG tablet Take 1 tablet (5 mg total) by mouth daily.  90 tablet  3   No current facility-administered medications for this visit.    LABS/IMAGING: No results found for this or any previous visit (from the past 48 hour(s)). No results found.  VITALS: BP 138/82  Pulse 72  Ht 5\' 11"  (1.803 m)  Wt 187 lb 9.6 oz (85.095 kg)  BMI 26.18 kg/m2  EXAM: General appearance: alert and no distress Neck: no carotid bruit and no JVD Lungs: clear to auscultation bilaterally Heart: regular rate and rhythm, S1, S2 normal, no  murmur, click, rub or gallop Abdomen: soft, non-tender; bowel sounds normal; no masses,  no organomegaly Extremities: extremities normal, atraumatic, no cyanosis or edema Pulses: 2+ and symmetric Skin: Skin color, texture, turgor normal. No rashes or lesions Neurologic: Grossly normal Psych: Mood, affect normal  EKG: Normal sinus rhythm at 72  ASSESSMENT: 1. Dyslipidemia 2. Family history of premature coronary disease  PLAN: 1.   Mr. Craig Townsend is doing well and continues to exercise without any difficulties. He is due for a lipid profile and we'll go ahead and recheck that along with a BNP to do his history of impaired fasting glucose. I plan to see him back annually or sooner as necessary.  Craig Nose, MD,  [facility] Attending Cardiologist [facility]  Craig Townsend 06/03/2013, 4:18 PM

## 2013-08-07 LAB — NMR LIPOPROFILE WITH LIPIDS
Cholesterol, Total: 159 mg/dL (ref <200)
HDL Particle Number: 34.9 umol/L (ref >=30.5)
HDL Size: 9.2 nm (ref >=9.2)
HDL-C: 64 mg/dL (ref >=40)
LDL (calc): 83 mg/dL (ref <100)
LDL Particle Number: 1019 nmol/L — ABNORMAL HIGH (ref <1000)
LDL Size: 20.9 nm (ref >20.5)
LP-IR Score: 32 (ref <=45)
Large HDL-P: 9.2 umol/L (ref >=4.8)
Large VLDL-P: 2.4 nmol/L (ref <=2.7)
Small LDL Particle Number: 323 nmol/L (ref <=527)
Triglycerides: 58 mg/dL (ref <150)
VLDL Size: 41.8 nm (ref <=46.6)

## 2013-08-11 ENCOUNTER — Encounter: Payer: Self-pay | Admitting: *Deleted

## 2013-12-14 ENCOUNTER — Ambulatory Visit (INDEPENDENT_AMBULATORY_CARE_PROVIDER_SITE_OTHER): Payer: 59 | Admitting: Internal Medicine

## 2013-12-14 ENCOUNTER — Encounter: Payer: Self-pay | Admitting: Internal Medicine

## 2013-12-14 VITALS — BP 128/86 | HR 74 | Ht 71.0 in | Wt 189.6 lb

## 2013-12-14 DIAGNOSIS — J0101 Acute recurrent maxillary sinusitis: Secondary | ICD-10-CM

## 2013-12-14 DIAGNOSIS — J0100 Acute maxillary sinusitis, unspecified: Secondary | ICD-10-CM

## 2013-12-14 MED ORDER — AZITHROMYCIN 250 MG PO TABS: ORAL_TABLET | ORAL | Status: DC

## 2013-12-14 NOTE — Patient Instructions (Signed)
Script sent for Zpak   Fluids and comfort meds as needed  Please call if we can help

## 2013-12-14 NOTE — Progress Notes (Signed)
HPI  11/14/10- 54 yoM never smoker, followed for rhinitis, hx asthma, URI and sinusitis.  Last here 03/28/09- Note reviewed.  Has avoided colds better in last few years- still has 6212 and 669 yo children bringing colds home.  In last 2 months is waking more with headache and wants to see if if is allergy.  Vaguely anterior headaches, more often early morning. Some dizziness. Ears ok. Nose ok. Throat gets uncomfortable, not really sore. Denies fever, sweat, swollen glands. Chest ok and denies rest of ROS. Some good days.  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  Sleeps better some days than others.   02/13/13- 56 yoM never smoker with history of rhinitis, mild intermittent asthma, acute sinusitis. FOLLOWS FOR:  Sinus pressure, PND and sore throat w/ cough on and off non-productive Recover cold September 10 has had persistent cough with thick mucus, retro-orbital headache. Sore throat is getting better. No fever.  12/14/13- 57 yoM never smoker with history of rhinitis, mild intermittent asthma, acute sinusitis. ACUTE VISIT:  Having cough non-productive some sore throat and bad headachaches x 3-4 days [place]aught something from family member while in [place].  ROS-see HPI Constitutional:   No-   weight loss, night sweats, fevers, chills, fatigue, lassitude. HEENT:   +  headaches, difficulty swallowing, tooth/dental problems, +sore throat,       No-  sneezing, itching, ear ache, +nasal congestion, post nasal drip,  CV:  No-   chest pain, orthopnea, PND, swelling in lower extremities, anasarca, dizziness, palpitations Resp: No-   shortness of breath with exertion or at rest.              +  productive cough,  No non-productive cough,  No- coughing up of blood.              No-   change in color of mucus.  No- wheezing.   Skin: No-   rash or lesions. GI:  No-   heartburn, indigestion, abdominal pain, nausea, vomiting,  GU: . MS:  No-   joint pain or swelling.   Neuro-      nothing unusual Psych:  No- change in mood or affect. No depression or anxiety.  No memory loss.  OBJ- Physical Exam General- Alert, Oriented, Affect-appropriate, Distress- none acute Skin- rash-none, lesions- none, excoriation- none Lymphadenopathy- none Head- atraumatic            Eyes- Gross vision intact, PERRLA, conjunctivae and secretions clear            Ears- Hearing, canals-normal            Nose- +mucus bridging, no-Septal dev, polyps, erosion, perforation             Throat- Mallampati III , mucosa clear , drainage- none, tonsils- atrophic Neck- flexible , trachea midline, no stridor , thyroid nl, carotid no bruit Chest - symmetrical excursion , unlabored           Heart/CV- RRR , no murmur , no gallop  , no rub, nl s1 s2                           - JVD- none , edema- none, stasis changes- none, varices- none           Lung- clear to P&A, wheeze- none, cough- none , dullness-none, rub- none           Chest wall-  Abd-  Br/ Gen/ Rectal- Not done, not indicated Extrem- cyanosis- none, clubbing, none, atrophy- none, strength- nl Neuro- grossly intact to observation

## 2014-02-03 ENCOUNTER — Telehealth: Payer: Self-pay | Admitting: Internal Medicine

## 2014-02-03 MED ORDER — AMOXICILLIN-POT CLAVULANATE 875-125 MG PO TABS: 1.0000 | ORAL_TABLET | Freq: Two times a day (BID) | ORAL | Status: DC

## 2014-02-03 MED ORDER — PREDNISONE 20 MG PO TABS: 20.0000 mg | ORAL_TABLET | Freq: Every day | ORAL | Status: DC

## 2014-02-03 NOTE — Telephone Encounter (Signed)
Called and spoke with pt and he is aware of CY recs.  meds have been sent to the pharmacy and pt is aware. Nothing further is needed.

## 2014-02-03 NOTE — Telephone Encounter (Signed)
Called spoke with pt. C/o slight nasal congestion, unable to taste/smell things, pressure behind eyes, very little dry cough x couple days. Pt has tried flonase, zyrtec, sudafed. Please advise CDY thanks  No Known Allergies   Current Outpatient Prescriptions on File Prior to Visit  Medication Sig Dispense Refill  . acyclovir (ZOVIRAX) 800 MG tablet as needed.       Marland Kitchen aspirin 81 MG tablet Take 81 mg by mouth daily.        Marland Kitchen azithromycin (ZITHROMAX) 250 MG tablet 2 today then one daily  6 each  0  . cetirizine (ZYRTEC) 10 MG tablet Take 10 mg by mouth daily as needed for allergies.      Marland Kitchen nystatin-triamcinolone (MYCOLOG II) cream Apply 1 application topically as needed.      . rosuvastatin (CRESTOR) 5 MG tablet Take 1 tablet (5 mg total) by mouth daily.  90 tablet  3   No current facility-administered medications on file prior to visit.

## 2014-02-03 NOTE — Telephone Encounter (Signed)
Suggest prednisone 20 mg daily x 4 days, # 4                           Augmentin 875 BID, # 14                Sudafed once or twice daily

## 2014-02-15 ENCOUNTER — Ambulatory Visit (INDEPENDENT_AMBULATORY_CARE_PROVIDER_SITE_OTHER): Payer: 59 | Admitting: Internal Medicine

## 2014-02-15 ENCOUNTER — Encounter: Payer: Self-pay | Admitting: Internal Medicine

## 2014-02-15 VITALS — BP 124/80 | HR 77 | Ht 71.0 in | Wt 190.0 lb

## 2014-02-15 DIAGNOSIS — Z23 Encounter for immunization: Secondary | ICD-10-CM

## 2014-02-15 MED ORDER — ALBUTEROL SULFATE HFA 108 (90 BASE) MCG/ACT IN AERS: 2.0000 | INHALATION_SPRAY | Freq: Four times a day (QID) | RESPIRATORY_TRACT | Status: DC | PRN

## 2014-02-15 NOTE — Patient Instructions (Addendum)
Flu vax  Pneumovax 23 pneumococcal vaccine  Script for albuterol rescue inhaler- sent

## 2014-02-15 NOTE — Progress Notes (Signed)
HPI  11/14/10- 54 yoM never smoker, followed for rhinitis, hx asthma, URI and sinusitis.  Last here 03/28/09- Note reviewed.  Has avoided colds better in last few years- still has 31 and 59 yo children bringing colds home.  In last 2 months is waking more with headache and wants to see if if is allergy.  Vaguely anterior headaches, more often early morning. Some dizziness. Ears ok. Nose ok. Throat gets uncomfortable, not really sore. Denies fever, sweat, swollen glands. Chest ok and denies rest of ROS. Some good days.  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  Sleeps better some days than others.   02/13/13- 56 yoM never smoker with history of rhinitis, mild intermittent asthma, acute sinusitis. FOLLOWS FOR:  Sinus pressure, PND and sore throat w/ cough on and off non-productive Recover cold September 10 has had persistent cough with thick mucus, retro-orbital headache. Sore throat is getting better. No fever.  12/14/13- 57 yoM never smoker with history of rhinitis, mild intermittent asthma, acute sinusitis. ACUTE VISIT:  Having cough non-productive some sore throat and bad headachaches x 3-4 days  02/15/14- 57 yoM never smoker with history of rhinitis, mild intermittent asthma, acute sinusitis. FOLLOWS FOR: Pt states his sinus pressure has resolved.  Interested in flu and pneumonia shot. Pt c/o some runny nose, PND.    ROS-see HPI Constitutional:   No-   weight loss, night sweats, fevers, chills, fatigue, lassitude. HEENT:   No-  headaches, difficulty swallowing, tooth/dental problems, +sore throat,       No-  sneezing, itching, ear ache, +nasal congestion, post nasal drip,  CV:  No-   chest pain, orthopnea, PND, swelling in lower extremities, anasarca, dizziness, palpitations Resp: No-   shortness of breath with exertion or at rest.              +  productive cough,  No non-productive cough,  No- coughing up of blood.              No-   change in color of  mucus.  No- wheezing.   Skin: No-   rash or lesions. GI:  No-   heartburn, indigestion, abdominal pain, nausea, vomiting,  GU: . MS:  No-   joint pain or swelling.   Neuro-     nothing unusual Psych:  No- change in mood or affect. No depression or anxiety.  No memory loss.  OBJ- Physical Exam General- Alert, Oriented, Affect-appropriate, Distress- none acute Skin- rash-none, lesions- none, excoriation- none Lymphadenopathy- none Head- atraumatic            Eyes- Gross vision intact, PERRLA, conjunctivae and secretions clear            Ears- Hearing, canals-normal            Nose- +mucus bridging, no-Septal dev, polyps, erosion, perforation             Throat- Mallampati III , mucosa clear , drainage- none, tonsils- atrophic Neck- flexible , trachea midline, no stridor , thyroid nl, carotid no bruit Chest - symmetrical excursion , unlabored           Heart/CV- RRR , no murmur , no gallop  , no rub, nl s1 s2                           - JVD- none , edema- none, stasis changes- none, varices- none  Lung- clear to P&A, wheeze- none, cough- none , dullness-none, rub- none           Chest wall-  Abd-  Br/ Gen/ Rectal- Not done, not indicated Extrem- cyanosis- none, clubbing, none, atrophy- none, strength- nl Neuro- grossly intact to observation

## 2014-03-21 NOTE — Assessment & Plan Note (Addendum)
Upper respiratory infection with early sinusitis and tracheobronchitis Plan Z-Pak, fluids, nasal rinse, decongestants

## 2014-06-10 ENCOUNTER — Telehealth: Payer: Self-pay | Admitting: Internal Medicine

## 2014-06-10 NOTE — Telephone Encounter (Signed)
I called made pt aware of recs. Nothing further needed 

## 2014-06-10 NOTE — Telephone Encounter (Signed)
Pt states he has had for 1-1.5 month's now of "clump" in his throat. Has nasal drainage and take Zyrtec prn(doesn't seem to help much). Pt denies any trouble breathing, sore or raw throat, and denies eating anything that has gotten stuck in his throat area. Pt states the feelings stay longer than they stay gone. Pt has his tonsils and has not looked at his throat in the mirror to see if anything looks swollen in his throat.   Pt would like to have recs from CY on what to do.  Last seen 02-15-14. Please advise. Thanks.   [place] [place] [place]

## 2014-06-10 NOTE — Telephone Encounter (Signed)
Some patients with this complaint have reported help by drinking juice glass of diet cranberry juice each morning. It is astringent and seems to cut that feeling. If no help, then suggest see ENT to examine throat.

## 2014-06-17 ENCOUNTER — Ambulatory Visit (INDEPENDENT_AMBULATORY_CARE_PROVIDER_SITE_OTHER): Payer: 59 | Admitting: Internal Medicine

## 2014-06-17 ENCOUNTER — Encounter: Payer: Self-pay | Admitting: Internal Medicine

## 2014-06-17 VITALS — BP 124/72 | HR 69 | Ht 71.0 in | Wt 192.6 lb

## 2014-06-17 DIAGNOSIS — Z8249 Family history of ischemic heart disease and other diseases of the circulatory system: Secondary | ICD-10-CM

## 2014-06-17 DIAGNOSIS — E785 Hyperlipidemia, unspecified: Secondary | ICD-10-CM

## 2014-06-17 MED ORDER — ROSUVASTATIN CALCIUM 5 MG PO TABS: 5.0000 mg | ORAL_TABLET | Freq: Every day | ORAL | Status: DC

## 2014-06-17 NOTE — Patient Instructions (Signed)
Your physician recommends that you return for lab work at your convenience (fasting)  Your Crestor has been refilled.   Your physician wants you to follow-up in: 1 year with Dr. Rennis GoldenHilty. You will receive a reminder letter in the mail two months in advance. If you don't receive a letter, please call our office to schedule the follow-up appointment.

## 2014-06-17 NOTE — Progress Notes (Signed)
OFFICE NOTE  Chief Complaint:  No complaints  Primary Care Physician: Lupe Carney, MD  HPI:  TRUST LEH  is a 59 year old gentleman here for an annual check-up. He has a history of dyslipidemia and mild coronary disease. We have been medically managing him, and he had actually a fairly good NMR profile last year with a particle number of 1080. LDL was 52 on Crestor 5 at bedtime. Unfortunately, he has impaired fasting glucose and with a two-hour glucose tolerance test, the glucose peaked at 196, down to 141. He has not yet required medications for that. Overall the past year, he has had no worsening shortness of breath, chest pain, palpitations, presyncope, or syncopal symptoms. He has been exercising and reported that his fasting glucose is now down to 90. He continues to take Crestor without any difficulty and is due for a lipid profile.  Also of note he was recently diagnosed with a right inguinal hernia which is considered small and he was not advised to undergo surgical repair.  Mr. Swart has no new complaints. He denies any chest pain or worsening shortness of breath. Continues to be quite active.  PMHx:  Past Medical History  Diagnosis Date  . Asthma   . Acute sinusitis   . Hyperlipidemia   . History of nuclear stress test 05/2007    bruce myoview; normal pattern of perfusion in all regions, post-stress EF 74%, normal low risk study   . Dyslipidemia   . CAD (coronary artery disease)     mild  . Impaired glucose tolerance   . Family history of heart disease     Past Surgical History  Procedure Laterality Date  . Parathyroidectomy    . Suprapubic incision      ? volvulus   . Appendectomy  10/2004    exploratory lap, resection of Meckel's diverticulum (Dr. Biagio Borg)   . Transthoracic echocardiogram  2003    EF 60%, trivial TR    FAMHx:  Family History  Problem Relation Age of Onset  . Asthma Father     deceased at age 39  . Heart disease Father 39  .  Kidney disease Sister     SOCHx:   reports that he quit smoking about 16 years ago. He has never used smokeless tobacco. He reports that he drinks alcohol. He reports that he does not use illicit drugs.  ALLERGIES:  No Known Allergies  ROS: A comprehensive review of systems was negative.  HOME MEDS: Current Outpatient Prescriptions  Medication Sig Dispense Refill  . acyclovir (ZOVIRAX) 800 MG tablet as needed.     Marland Kitchen albuterol (VENTOLIN HFA) 108 (90 BASE) MCG/ACT inhaler Inhale 2 puffs into the lungs 4 (four) times daily as needed. rescue 1 Inhaler prn  . aspirin 81 MG tablet Take 81 mg by mouth daily.      . cetirizine (ZYRTEC) 10 MG tablet Take 10 mg by mouth daily as needed for allergies.    Marland Kitchen nystatin-triamcinolone (MYCOLOG II) cream Apply 1 application topically as needed.    . rosuvastatin (CRESTOR) 5 MG tablet Take 1 tablet (5 mg total) by mouth daily. 90 tablet 3   No current facility-administered medications for this visit.    LABS/IMAGING: No results found for this or any previous visit (from the past 48 hour(s)). No results found.  VITALS: BP 124/72 mmHg  Pulse 69  Ht  (1.803 m)  Wt 192 lb 9.6 oz (87.363 kg)  BMI 26.87 kg/m2  EXAM:  General appearance: alert and no distress Neck: no carotid bruit and no JVD Lungs: clear to auscultation bilaterally Heart: regular rate and rhythm, S1, S2 normal, no murmur, click, rub or gallop Abdomen: soft, non-tender; bowel sounds normal; no masses,  no organomegaly Extremities: extremities normal, atraumatic, no cyanosis or edema Pulses: 2+ and symmetric Skin: Skin color, texture, turgor normal. No rashes or lesions Neurologic: Grossly normal Psych: Mood, affect normal  EKG: Normal sinus rhythm at 69  ASSESSMENT: 1. Dyslipidemia 2. Family history of premature coronary disease  PLAN: 1.   Mr. [name] is doing well and continues to exercise without any difficulties. He is due for a lipid profile and we'll go ahead  and recheck that. I plan to see him back annually or sooner as necessary.  Chrystie NoseKenneth C. Keldric Poyer, MD, [facility] Attending Cardiologist [facility]  Ireta Pullman C 06/17/2014, 4:23 PM

## 2014-06-30 ENCOUNTER — Telehealth: Payer: Self-pay | Admitting: Internal Medicine

## 2014-06-30 MED ORDER — CEFDINIR 300 MG PO CAPS: 300.0000 mg | ORAL_CAPSULE | Freq: Two times a day (BID) | ORAL | Status: DC

## 2014-06-30 NOTE — Telephone Encounter (Signed)
Spoke with pt, aware of recs.  Med sent in.  Nothing further needed.  

## 2014-06-30 NOTE — Telephone Encounter (Signed)
Offer cefdinir 300 mg, # 14, 1 twice daily, refill x 1 

## 2014-06-30 NOTE — Telephone Encounter (Signed)
Spoke with pt, states he is still coughing, having congestion collecting in his throat, prod with green mucus, headache X2 weeks.   Tried Mucinex DM and sudafed which hasn't been helping.  Denies fever, sob.  Uses [facility] [address] [place].    Last ov: 02/15/14 Next ov: 07/26/14  Dr young please advise.  Thank you.   No Known Allergies Current Outpatient Prescriptions on File Prior to Visit  Medication Sig Dispense Refill  . acyclovir (ZOVIRAX) 800 MG tablet as needed.     Marland Kitchen. albuterol (VENTOLIN HFA) 108 (90 BASE) MCG/ACT inhaler Inhale 2 puffs into the lungs 4 (four) times daily as needed. rescue 1 Inhaler prn  . aspirin 81 MG tablet Take 81 mg by mouth daily.      . cetirizine (ZYRTEC) 10 MG tablet Take 10 mg by mouth daily as needed for allergies.    Marland Kitchen. nystatin-triamcinolone (MYCOLOG II) cream Apply 1 application topically as needed.    . rosuvastatin (CRESTOR) 5 MG tablet Take 1 tablet (5 mg total) by mouth daily. 90 tablet 3   No current facility-administered medications on file prior to visit.

## 2014-07-26 ENCOUNTER — Ambulatory Visit (INDEPENDENT_AMBULATORY_CARE_PROVIDER_SITE_OTHER): Payer: 59 | Admitting: Internal Medicine

## 2014-07-26 ENCOUNTER — Encounter: Payer: Self-pay | Admitting: Internal Medicine

## 2014-07-26 VITALS — BP 118/74 | HR 77 | Ht 71.0 in | Wt 192.6 lb

## 2014-07-26 DIAGNOSIS — J209 Acute bronchitis, unspecified: Secondary | ICD-10-CM

## 2014-07-26 DIAGNOSIS — J0101 Acute recurrent maxillary sinusitis: Secondary | ICD-10-CM

## 2014-07-26 DIAGNOSIS — J309 Allergic rhinitis, unspecified: Secondary | ICD-10-CM

## 2014-07-26 MED ORDER — PHENYLEPHRINE HCL 1 % NA SOLN
3.0000 [drp] | Freq: Once | NASAL | Status: AC
  Administered 2014-07-26: 3 [drp] via NASAL

## 2014-07-26 MED ORDER — DOXYCYCLINE HYCLATE 100 MG PO TABS: ORAL_TABLET | ORAL | Status: DC

## 2014-07-26 NOTE — Progress Notes (Signed)
HPI  11/14/10- 54 yoM never smoker, followed for rhinitis, hx asthma, URI and sinusitis.  Last here 03/28/09- Note reviewed.  Has avoided colds better in last few years- still has 66 and 60 yo children bringing colds home.  In last 2 months is waking more with headache and wants to see if if is allergy.  Vaguely anterior headaches, more often early morning. Some dizziness. Ears ok. Nose ok. Throat gets uncomfortable, not really sore. Denies fever, sweat, swollen glands. Chest ok and denies rest of ROS. Some good days.  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  Sleeps better some days than others.   02/13/13- 56 yoM never smoker with history of rhinitis, mild intermittent asthma, acute sinusitis. FOLLOWS FOR:  Sinus pressure, PND and sore throat w/ cough on and off non-productive Recover cold September 10 has had persistent cough with thick mucus, retro-orbital headache. Sore throat is getting better. No fever.  12/14/13- 57 yoM never smoker with history of rhinitis, mild intermittent asthma, acute sinusitis. ACUTE VISIT:  Having cough non-productive some sore throat and bad headachaches x 3-4 days  02/15/14- 57 yoM never smoker with history of rhinitis, mild intermittent asthma, acute sinusitis. FOLLOWS FOR: Pt states his sinus pressure has resolved.  Interested in flu and pneumonia shot. Pt c/o some runny nose, PND.   07/26/14-  58 yoM never smoker with history of rhinitis, mild intermittent asthma, acute sinusitis. ACUTE VISIT: Pt states he feels slighty better today but was having increased clogged feelings in chest and nasal area. He has occasional fullness in chest and ears. We called Cefdinir and he refilled it. Purulent sputum now clear.  C/o still sinus and ear pressure. Chest better.  ROS-see HPI Constitutional:   No-   weight loss, night sweats, fevers, chills, fatigue, lassitude. HEENT:   No-  headaches, difficulty swallowing, tooth/dental problems,  +sore throat,       No-  sneezing, itching, ear ache, +nasal congestion, post nasal drip,  CV:  No-   chest pain, orthopnea, PND, swelling in lower extremities, anasarca, dizziness, palpitations Resp: No-   shortness of breath with exertion or at rest.              +  productive cough,  No non-productive cough,  No- coughing up of blood.              No-   change in color of mucus.  No- wheezing.   Skin: No-   rash or lesions. GI:  No-   heartburn, indigestion, abdominal pain, nausea, vomiting,  GU: . MS:  No-   joint pain or swelling.   Neuro-     nothing unusual Psych:  No- change in mood or affect. No depression or anxiety.  No memory loss.  OBJ- Physical Exam General- Alert, Oriented, Affect-appropriate, Distress- none acute Skin- rash-none, lesions- none, excoriation- none Lymphadenopathy- none Head- atraumatic            Eyes- Gross vision intact, PERRLA, conjunctivae and secretions clear            Ears- Hearing, canals-normal, +TM's dull, slightly retracted            Nose- +mucus bridging, no-Septal dev, polyps, erosion, perforation             Throat- Mallampati III-IV , mucosa clear , drainage- none, tonsils- atrophic Neck- flexible , trachea midline, no stridor , thyroid nl, carotid no bruit Chest - symmetrical excursion , unlabored  Heart/CV- RRR , no murmur , no gallop  , no rub, nl s1 s2                           - JVD- none , edema- none, stasis changes- none, varices- none           Lung- clear to P&A, wheeze- none, cough+raspy , dullness-none, rub- none           Chest wall-  Abd-  Br/ Gen/ Rectal- Not done, not indicated Extrem- cyanosis- none, clubbing, none, atrophy- none, strength- nl Neuro- grossly intact to observation

## 2014-07-26 NOTE — Patient Instructions (Signed)
Neb neo nasal   Depo 80  Script printed for doxycycline antibiotic

## 2014-07-27 DIAGNOSIS — J209 Acute bronchitis, unspecified: Secondary | ICD-10-CM | POA: Insufficient documentation

## 2014-07-27 NOTE — Assessment & Plan Note (Addendum)
Improved after cefdinir, with residual cough Plan - depomedrol

## 2014-07-27 NOTE — Assessment & Plan Note (Signed)
Probably resolving acute bacterial maxillary sinusitis with eustachian dysfunction and bronchitis Plan- neb nasal decongestant, depomedrol, doxycycline to hold

## 2014-10-21 ENCOUNTER — Other Ambulatory Visit: Payer: Self-pay | Admitting: Internal Medicine

## 2014-10-22 LAB — NMR, LIPOPROFILE
Cholesterol: 152 mg/dL (ref 100–199)
HDL Cholesterol by NMR: 56 mg/dL (ref >39)
HDL Particle Number: 36.8 umol/L (ref >=30.5)
LDL Particle Number: 1039 nmol/L — ABNORMAL HIGH (ref <1000)
LDL Size: 20.6 nm (ref >20.5)
LDL-C: 85 mg/dL (ref 0–99)
LP-IR SCORE: 29 (ref <=45)
Small LDL Particle Number: 513 nmol/L (ref <=527)
Triglycerides by NMR: 56 mg/dL (ref 0–149)

## 2014-10-25 ENCOUNTER — Encounter: Payer: Self-pay | Admitting: *Deleted

## 2015-01-27 ENCOUNTER — Encounter: Payer: Self-pay | Admitting: Surgery

## 2015-02-18 ENCOUNTER — Telehealth: Payer: Self-pay | Admitting: Internal Medicine

## 2015-02-18 NOTE — Telephone Encounter (Signed)
Pt called in stating that he wanted Dr. Rennis Golden to look at the EKG that was taken while he was at the Urgent Care. Please review and get back with him  Thanks

## 2015-02-18 NOTE — Telephone Encounter (Signed)
Will leave for NL triage/ Jenna. Not sure if a copy of the EKG has been received at [facility].

## 2015-02-21 NOTE — Telephone Encounter (Signed)
Pt says he is still waiting to hear something from Friday.

## 2015-02-21 NOTE — Telephone Encounter (Signed)
Patient states [facility] did an EKG and it reported a possible infract/scar. Informed patient that EKG machines print an interpretation in the report, which may not correlate with the MD interpretation. He reports he was asked during this visit if he had a cardiologist, which he said he does. Asked that patient had MD office or patient fax over EKG for MD review.   Patient needs this looked at for his job - he works at [employer]. He may need a letter explaining that EKG was reviewed by cardiologist and is/is not OK and that his BP is controlled.   He will fax EKG tomorrow 02/22/15

## 2015-02-22 ENCOUNTER — Encounter: Payer: Self-pay | Admitting: Internal Medicine

## 2015-02-22 NOTE — Telephone Encounter (Signed)
Letter composed by MD and patient notified letter is ready to be picked up

## 2015-02-22 NOTE — Telephone Encounter (Addendum)
Patient notified that EKG was unchanged from previous.   He would like a letter addressed to "To whom it may concern" regarding his EKG - stating that EKG is unchanged from previous, no acute issues. Suspect lead placement issues.   Patient would like a call back when letter is ready.

## 2015-02-28 ENCOUNTER — Ambulatory Visit (INDEPENDENT_AMBULATORY_CARE_PROVIDER_SITE_OTHER): Payer: 59 | Admitting: Internal Medicine

## 2015-02-28 ENCOUNTER — Ambulatory Visit: Payer: 59 | Admitting: Pulmonary Disease

## 2015-02-28 ENCOUNTER — Encounter: Payer: Self-pay | Admitting: Internal Medicine

## 2015-02-28 VITALS — BP 110/72 | HR 79 | Ht 71.0 in | Wt 187.2 lb

## 2015-02-28 DIAGNOSIS — J4520 Mild intermittent asthma, uncomplicated: Secondary | ICD-10-CM

## 2015-02-28 DIAGNOSIS — J3089 Other allergic rhinitis: Secondary | ICD-10-CM

## 2015-02-28 DIAGNOSIS — J302 Other seasonal allergic rhinitis: Secondary | ICD-10-CM

## 2015-02-28 DIAGNOSIS — Z23 Encounter for immunization: Secondary | ICD-10-CM | POA: Diagnosis not present

## 2015-02-28 DIAGNOSIS — J309 Allergic rhinitis, unspecified: Secondary | ICD-10-CM

## 2015-02-28 NOTE — Patient Instructions (Signed)
Flu vax  Ok to use occasional Zyrtec for allergie if needed

## 2015-02-28 NOTE — Progress Notes (Signed)
HPI  11/14/10- 59 yoM never smoker, followed for rhinitis, hx asthma, URI and sinusitis.  Last here 03/28/09- Note reviewed.  Has avoided colds better in last few years- still has 60 and 60 yo children bringing colds home.  In last 2 months is waking more with headache and wants to see if if is allergy.  Vaguely anterior headaches, more often early morning. Some dizziness. Ears ok. Nose ok. Throat gets uncomfortable, not really sore. Denies fever, sweat, swollen glands. Chest ok and denies rest of ROS. Some good days.  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  Sleeps better some days than others.   02/13/13- 59 yoM never smoker with history of rhinitis, mild intermittent asthma, acute sinusitis. FOLLOWS FOR:  Sinus pressure, PND and sore throat w/ cough on and off non-productive Recover cold September 10 has had persistent cough with thick mucus, retro-orbital headache. Sore throat is getting better. No fever.  12/14/13- 59 yoM never smoker with history of rhinitis, mild intermittent asthma, acute sinusitis. ACUTE VISIT:  Having cough non-productive some sore throat and bad headachaches x 3-4 days  02/15/14- 59 yoM never smoker with history of rhinitis, mild intermittent asthma, acute sinusitis. FOLLOWS FOR: Pt states his sinus pressure has resolved.  Interested in flu and pneumonia shot. Pt c/o some runny nose, PND.   07/26/14-  59 yoM never smoker with history of rhinitis, mild intermittent asthma, acute sinusitis. ACUTE VISIT: Pt states he feels slighty better today but was having increased clogged feelings in chest and nasal area. He has occasional fullness in chest and ears. We called Cefdinir and he refilled it. Purulent sputum now clear.  C/o still sinus and ear pressure. Chest better.  02/28/15- 59 yoM never smoker with history of rhinitis, mild intermittent asthma, acute sinusitis. FOLLOWS FOR: Pt would like to discuss Zytrec and see if thats good enough  for his allergies and sinus issues.  Rare wheeze. Feels he is doing well.  ROS-see HPI Constitutional:   No-   weight loss, night sweats, fevers, chills, fatigue, lassitude. HEENT:   No-  headaches, difficulty swallowing, tooth/dental problems, +sore throat,       No-  sneezing, itching, ear ache, +nasal congestion, post nasal drip,  CV:  No-   chest pain, orthopnea, PND, swelling in lower extremities, anasarca, dizziness, palpitations Resp: No-   shortness of breath with exertion or at rest.                productive cough,  No non-productive cough,  No- coughing up of blood.              No-   change in color of mucus.  No- wheezing.   Skin: No-   rash or lesions. GI:  No-   heartburn, indigestion, abdominal pain, nausea, vomiting,  GU: . MS:  No-   joint pain or swelling.   Neuro-     nothing unusual Psych:  No- change in mood or affect. No depression or anxiety.  No memory loss.  OBJ- Physical Exam General- Alert, Oriented, Affect-appropriate, Distress- none acute Skin- rash-none, lesions- none, excoriation- none Lymphadenopathy- none Head- atraumatic            Eyes- Gross vision intact, PERRLA, conjunctivae and secretions clear            Ears- Hearing, canals-normal, +TM's dull, slightly retracted            Nose- clear no-Septal dev, polyps, erosion, perforation  Throat- Mallampati III-IV , mucosa clear , drainage- none, tonsils- atrophic Neck- flexible , trachea midline, no stridor , thyroid nl, carotid no bruit Chest - symmetrical excursion , unlabored           Heart/CV- RRR , no murmur , no gallop  , no rub, nl s1 s2                           - JVD- none , edema- none, stasis changes- none, varices- none           Lung- clear to P&A, wheeze- none, cough-none , dullness-none, rub- none           Chest wall-  Abd-  Br/ Gen/ Rectal- Not done, not indicated Extrem- cyanosis- none, clubbing, none, atrophy- none, strength- nl Neuro- grossly intact to  observation

## 2015-03-01 ENCOUNTER — Encounter: Payer: Self-pay | Admitting: Internal Medicine

## 2015-03-06 DIAGNOSIS — J302 Other seasonal allergic rhinitis: Secondary | ICD-10-CM | POA: Insufficient documentation

## 2015-03-06 DIAGNOSIS — J3089 Other allergic rhinitis: Secondary | ICD-10-CM

## 2015-03-06 NOTE — Assessment & Plan Note (Signed)
Good control. Not needing rescue inhaler. Asthma does not disturb his sleep.

## 2015-03-06 NOTE — Assessment & Plan Note (Signed)
Occasional antihistamine

## 2015-03-10 ENCOUNTER — Encounter: Payer: Self-pay | Admitting: Internal Medicine

## 2015-03-10 ENCOUNTER — Ambulatory Visit (INDEPENDENT_AMBULATORY_CARE_PROVIDER_SITE_OTHER): Payer: 59 | Admitting: Internal Medicine

## 2015-03-10 VITALS — BP 156/83 | HR 68 | Ht 71.0 in | Wt 187.7 lb

## 2015-03-10 DIAGNOSIS — E785 Hyperlipidemia, unspecified: Secondary | ICD-10-CM

## 2015-03-10 DIAGNOSIS — Z8249 Family history of ischemic heart disease and other diseases of the circulatory system: Secondary | ICD-10-CM | POA: Diagnosis not present

## 2015-03-10 MED ORDER — ROSUVASTATIN CALCIUM 5 MG PO TABS: 5.0000 mg | ORAL_TABLET | Freq: Every day | ORAL | Status: DC

## 2015-03-10 NOTE — Patient Instructions (Signed)
Your physician wants you to follow-up in: 1 year with Dr. Hilty. You will receive a reminder letter in the mail two months in advance. If you don't receive a letter, please call our office to schedule the follow-up appointment.  

## 2015-03-10 NOTE — Progress Notes (Signed)
OFFICE NOTE  Chief Complaint:  No complaints  Primary Care Physician: Dean Mitchell, MD Craig Townsend  is a 59 year old gentleman here for an annual check-up. He has a history of dyslipidemia and mild coronary disease. We have been medically managing him, and he had actually a fairly good NMR profile last year with a particle number of 1080. LDL was 52 on Crestor 5 at bedtime. Unfortunately, he has impaired fasting glucose and with a two-hour glucose tolerance test, the glucose peaked at 196, down to 141. He has not yet required medications for that. Overall the past year, he has had no worsening shortness of breath, chest pain, palpitations, presyncope, or syncopal symptoms. He has been exercising and reported that his fasting glucose is now down to 90. He continues to take Crestor without any difficulty and is due for a lipid profile.  Also of note he was recently diagnosed with a right inguinal hernia which is considered small and he was not advised to undergo surgical repair.  Mr. Cerasoli has no new complaints. He denies any chest pain or worsening shortness of breath. Continues to be quite active.  I saw Mr. Brander back in the office today. He apparently had a recent EKG which there was poor R-wave progression in the septal leads concerning for possible septal infarct. I felt that this likely was recently lead placement. We repeated the EKG in the office today and there is no evidence of prior MI. He's also had no symptoms to suggest that he's had a coronary event. The fact he is doing quite well. I did provide a letter to his employer regarding of the abnormalities on his EKG which I think are artifactual.  PMHx:  Past Medical History  Diagnosis Date  . Asthma   . Acute sinusitis   . Hyperlipidemia   . History of nuclear stress test 05/2007    bruce myoview; normal pattern of perfusion in all regions, post-stress EF 74%, normal low risk study   . Dyslipidemia   . CAD  (coronary artery disease)     mild  . Impaired glucose tolerance   . Family history of heart disease     Past Surgical History  Procedure Laterality Date  . Parathyroidectomy    . Suprapubic incision      ? volvulus   . Appendectomy  10/2004    exploratory lap, resection of Meckel's diverticulum (Dr. Biagio Borg)   . Transthoracic echocardiogram  2003    EF 60%, trivial TR    FAMHx:  Family History  Problem Relation Age of Onset  . Asthma Father     deceased at age 58  . Heart disease Father 67  . Kidney disease Sister     SOCHx:   reports that he quit smoking about 17 years ago. He has never used smokeless tobacco. He reports that he drinks alcohol. He reports that he does not use illicit drugs.  ALLERGIES:  No Known Allergies  ROS: A comprehensive review of systems was negative.  HOME MEDS: Current Outpatient Prescriptions  Medication Sig Dispense Refill  . acyclovir (ZOVIRAX) 800 MG tablet as needed.     Marland Kitchen albuterol (VENTOLIN HFA) 108 (90 BASE) MCG/ACT inhaler Inhale 2 puffs into the lungs 4 (four) times daily as needed. rescue 1 Inhaler prn  . aspirin 81 MG tablet Take 81 mg by mouth daily.      . cetirizine (ZYRTEC) 10 MG tablet Take 10 mg by mouth daily  as needed for allergies.    Marland Kitchen nystatin-triamcinolone (MYCOLOG II) cream Apply 1 application topically as needed.    . rosuvastatin (CRESTOR) 5 MG tablet Take 1 tablet (5 mg total) by mouth daily. 90 tablet 3   No current facility-administered medications for this visit.    LABS/IMAGING: No results found for this or any previous visit (from the past 48 hour(s)). No results found.  VITALS: BP 156/83 mmHg  Pulse 68  Ht  (1.803 m)  Wt 187 lb 11.2 oz (85.14 kg)  BMI 26.19 kg/m2  EXAM: General appearance: alert and no distress Neck: no carotid bruit and no JVD Lungs: clear to auscultation bilaterally Heart: regular rate and rhythm, S1, S2 normal, no murmur, click, rub or gallop Abdomen: soft,  non-tender; bowel sounds normal; no masses,  no organomegaly Extremities: extremities normal, atraumatic, no cyanosis or edema Pulses: 2+ and symmetric Skin: Skin color, texture, turgor normal. No rashes or lesions Neurologic: Grossly normal Psych: Mood, affect normal  EKG: Normal sinus rhythm at 68  ASSESSMENT: 1. Dyslipidemia 2. Family history of premature coronary disease  PLAN: 1.   Mr. Mcnamee is doing well and continues to exercise without any difficulties. I do not believe his EKG looks significantly different from prior EKGs he's had no events concerning for coronary disease. He mentioned that his employer may want a Bruce exercise treadmill stress test. Should he call in regarding that, we can go ahead and certainly arrange for a treadmill stress test without a prior office visit.  Plan to see him back annually or sooner as necessary.  Chrystie Nose, MD, [facility] Attending Cardiologist [facility]  Lisette Abu Shelvia Fojtik 03/10/2015, 5:09 PM

## 2015-06-30 ENCOUNTER — Telehealth: Payer: Self-pay | Admitting: Internal Medicine

## 2015-06-30 MED ORDER — AZITHROMYCIN 250 MG PO TABS: ORAL_TABLET | ORAL | Status: AC

## 2015-06-30 NOTE — Telephone Encounter (Signed)
Spoke with pt. He is aware of CY's recommendation. Rx has been sent in. Nothing further was needed. 

## 2015-06-30 NOTE — Telephone Encounter (Signed)
Spoke with pt. Reports sore throat, headache, cough and chest congestion. Cough is non productive. Denies SOB, chest tightness, wheezing or fever. Onset was 1 week ago. Has been taking Sudafed with minimal relief. Would like to have something called in. CY - please advise. Thanks.  No Known Allergies Current Outpatient Prescriptions on File Prior to Visit  Medication Sig Dispense Refill  . acyclovir (ZOVIRAX) 800 MG tablet as needed.     Marland Kitchen albuterol (VENTOLIN HFA) 108 (90 BASE) MCG/ACT inhaler Inhale 2 puffs into the lungs 4 (four) times daily as needed. rescue 1 Inhaler prn  . aspirin 81 MG tablet Take 81 mg by mouth daily.      . cetirizine (ZYRTEC) 10 MG tablet Take 10 mg by mouth daily as needed for allergies.    Marland Kitchen nystatin-triamcinolone (MYCOLOG II) cream Apply 1 application topically as needed.    . rosuvastatin (CRESTOR) 5 MG tablet Take 1 tablet (5 mg total) by mouth daily. 90 tablet 3   No current facility-administered medications on file prior to visit.

## 2015-06-30 NOTE — Telephone Encounter (Signed)
Offer zpak 

## 2015-07-22 ENCOUNTER — Telehealth: Payer: Self-pay | Admitting: Internal Medicine

## 2015-07-22 MED ORDER — AZITHROMYCIN 250 MG PO TABS: ORAL_TABLET | ORAL | Status: DC

## 2015-07-22 NOTE — Telephone Encounter (Signed)
Spoke with pt and is aware of recs. RX sent in. Nothing further needed 

## 2015-07-22 NOTE — Telephone Encounter (Signed)
Called spoke with pt. He reports he is blowing out clear phlem from nose, PND, nasal cong, prod cough (green phlem) x 1 week now.  He is suppose to have hernia surgery on Tuesday and wants to get rid of this before then. Wants something called in. Please advise Dr. Maple Hudson thanks  No Known Allergies   Current Outpatient Prescriptions on File Prior to Visit  Medication Sig Dispense Refill  . acyclovir (ZOVIRAX) 800 MG tablet as needed.     Marland Kitchen albuterol (VENTOLIN HFA) 108 (90 BASE) MCG/ACT inhaler Inhale 2 puffs into the lungs 4 (four) times daily as needed. rescue 1 Inhaler prn  . aspirin 81 MG tablet Take 81 mg by mouth daily.      . cetirizine (ZYRTEC) 10 MG tablet Take 10 mg by mouth daily as needed for allergies.    Marland Kitchen nystatin-triamcinolone (MYCOLOG II) cream Apply 1 application topically as needed.    . rosuvastatin (CRESTOR) 5 MG tablet Take 1 tablet (5 mg total) by mouth daily. 90 tablet 3   No current facility-administered medications on file prior to visit.

## 2015-07-22 NOTE — Telephone Encounter (Signed)
Offer Zpak 

## 2016-03-05 ENCOUNTER — Encounter: Payer: Self-pay | Admitting: Internal Medicine

## 2016-03-05 ENCOUNTER — Ambulatory Visit: Payer: 59 | Admitting: Internal Medicine

## 2016-03-05 ENCOUNTER — Ambulatory Visit (INDEPENDENT_AMBULATORY_CARE_PROVIDER_SITE_OTHER): Payer: 59 | Admitting: Internal Medicine

## 2016-03-05 DIAGNOSIS — J3089 Other allergic rhinitis: Secondary | ICD-10-CM

## 2016-03-05 DIAGNOSIS — J302 Other seasonal allergic rhinitis: Secondary | ICD-10-CM

## 2016-03-05 DIAGNOSIS — J4520 Mild intermittent asthma, uncomplicated: Secondary | ICD-10-CM | POA: Diagnosis not present

## 2016-03-05 MED ORDER — ALBUTEROL SULFATE HFA 108 (90 BASE) MCG/ACT IN AERS: 2.0000 | INHALATION_SPRAY | Freq: Four times a day (QID) | RESPIRATORY_TRACT | 99 refills | Status: DC | PRN

## 2016-03-05 NOTE — Progress Notes (Signed)
HPI  M never smoker, followed for rhinitis, hx asthma, URI and sinusitis.     07/26/14-  58 yoM never smoker with history of rhinitis, mild intermittent asthma, acute sinusitis. ACUTE VISIT: Pt states he feels slighty better today but was having increased clogged feelings in chest and nasal area. He has occasional fullness in chest and ears. We called Cefdinir and he refilled it. Purulent sputum now clear.  C/o still sinus and ear pressure. Chest better.  02/28/15- 58 yoM never smoker with history of rhinitis, mild intermittent asthma, acute sinusitis. FOLLOWS FOR: Pt would like to discuss Zytrec and see if thats good enough for his allergies and sinus issues.  Rare wheeze. Feels he is doing well.  03/05/2016-60 year old male never smoker with history of rhinitis, mild intermittent asthma, acute sinusitis FOLLOWS FOR: Pt denies any SOB, wheezing, cough, or congestion at this time.  She is outdoors in the evening and at night more frequently because of children's athletic events. He wakes the next morning with scratchy throat. Never wheezes. Very rare use of rescue inhaler. Sees ENT/Dr. Pollyann Kennedy for rhinitis with postnasal drip  ROS-see HPI Constitutional:   No-   weight loss, night sweats, fevers, chills, fatigue, lassitude. HEENT:   No-  headaches, difficulty swallowing, tooth/dental problems, +sore throat,       No-  sneezing, itching, ear ache, +nasal congestion, +post nasal drip,  CV:  No-   chest pain, orthopnea, PND, swelling in lower extremities, anasarca, dizziness, palpitations Resp: No-   shortness of breath with exertion or at rest.                productive cough,  No non-productive cough,  No- coughing up of blood.              No-   change in color of mucus.  No- wheezing.   Skin: No-   rash or lesions. GI:  No-   heartburn, indigestion, abdominal pain, nausea, vomiting,  GU: . MS:  No-   joint pain or swelling.   Neuro-     nothing unusual Psych:  No- change in mood or  affect. No depression or anxiety.  No memory loss.  OBJ- Physical Exam General- Alert, Oriented, Affect-appropriate, Distress- none acute Skin- rash-none, lesions- none, excoriation- none Lymphadenopathy- none Head- atraumatic            Eyes- Gross vision intact, PERRLA, conjunctivae and secretions clear            Ears- Hearing, canals-normal,             Nose- clear no-Septal dev, polyps, erosion, perforation             Throat- Mallampati III-IV , mucosa clear , drainage- none, tonsils- atrophic Neck- flexible , trachea midline, no stridor , thyroid nl, carotid no bruit Chest - symmetrical excursion , unlabored           Heart/CV- RRR , no murmur , no gallop  , no rub, nl s1 s2                           - JVD- none , edema- none, stasis changes- none, varices- none           Lung- clear to P&A, wheeze- none, cough-none , dullness-none, rub- none           Chest wall-  Abd-  Br/ Gen/ Rectal- Not done, not indicated Extrem- cyanosis- none, clubbing,  none, atrophy- none, strength- nl Neuro- grossly intact to observation

## 2016-03-05 NOTE — Patient Instructions (Addendum)
Suggest you plan on updating pneumonia vaccine at age 60  Flu vax  Script printed for Ventolin albuterol rescue asthma inhaler to fill if needed  Sample Dymista nasal spray     Try 1-2 puffs each nostril at bedtime if needed, to see if it reduces post nasal drainage  Please call if we can help

## 2016-03-08 NOTE — Assessment & Plan Note (Signed)
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 Plan-sample Dymista nasal spray for trial with discussion

## 2016-03-08 NOTE — Assessment & Plan Note (Signed)
Usually very well controlled now. We agreed he should have access to a rescue inhaler in case needed. Plan-refill Ventolin inhaler, flu shot

## 2016-04-25 ENCOUNTER — Ambulatory Visit (INDEPENDENT_AMBULATORY_CARE_PROVIDER_SITE_OTHER): Payer: 59 | Admitting: Internal Medicine

## 2016-04-25 ENCOUNTER — Encounter: Payer: Self-pay | Admitting: Internal Medicine

## 2016-04-25 VITALS — BP 148/80 | HR 74 | Ht 71.0 in | Wt 192.0 lb

## 2016-04-25 DIAGNOSIS — Z8249 Family history of ischemic heart disease and other diseases of the circulatory system: Secondary | ICD-10-CM

## 2016-04-25 DIAGNOSIS — E785 Hyperlipidemia, unspecified: Secondary | ICD-10-CM | POA: Diagnosis not present

## 2016-04-25 MED ORDER — ROSUVASTATIN CALCIUM 5 MG PO TABS: 5.0000 mg | ORAL_TABLET | Freq: Every day | ORAL | 3 refills | Status: DC

## 2016-04-25 NOTE — Patient Instructions (Signed)
Medication Instructions:  Your physician recommends that you continue on your current medications as directed. Please refer to the Current Medication list given to you today.  Labwork: Your physician recommends that you return for lab work in: TODAY-LIPID  Testing/Procedures: NONE  Follow-Up: Your physician wants you to follow-up in: 12 MONTHS WITH DR HILTY. You will receive a reminder letter in the mail two months in advance. If you don't receive a letter, please call our office to schedule the follow-up appointment.  Any Other Special Instructions Will Be Listed Below (If Applicable).  If you need a refill on your cardiac medications before your next appointment, please call your pharmacy.  

## 2016-04-25 NOTE — Progress Notes (Signed)
OFFICE NOTE  Chief Complaint:  No complaints  Primary Care Physician: Lupe Carneyean Mitchell, MD  HPI:  Craig RabonRobert B Townsend  is a 60 year old gentleman here for an annual check-up. He has a history of dyslipidemia and mild coronary disease. We have been medically managing him, and he had actually a fairly good NMR profile last year with a particle number of 1080. LDL was 52 on Crestor 5 at bedtime. Unfortunately, he has impaired fasting glucose and with a two-hour glucose tolerance test, the glucose peaked at 196, down to 141. He has not yet required medications for that. Overall the past year, he has had no worsening shortness of breath, chest pain, palpitations, presyncope, or syncopal symptoms. He has been exercising and reported that his fasting glucose is now down to 90. He continues to take Crestor without any difficulty and is due for a lipid profile.  Also of note he was recently diagnosed with a right inguinal hernia which is considered small and he was not advised to undergo surgical repair.  Craig Townsend has no new complaints. He denies any chest pain or worsening shortness of breath. Continues to be quite active.  I saw Craig Townsend back in the office today. He apparently had a recent EKG which there was poor R-wave progression in the septal leads concerning for possible septal infarct. I felt that this likely was recently lead placement. We repeated the EKG in the office today and there is no evidence of prior MI. He's also had no symptoms to suggest that he's had a coronary event. The fact he is doing quite well. I did provide a letter to his employer regarding of the abnormalities on his EKG which I think are artifactual.  04/24/2016  Craig Townsend is seen today in follow-up. Over the past year he's done well. He denies any chest pain or worsening shortness of breath. He is due for repeat lipid profile. Blood pressure was mildly elevated today however came down to 130/78. Continues to be physically  active without any restrictions.  PMHx:  Past Medical History:  Diagnosis Date  . Acute sinusitis   . Asthma   . CAD (coronary artery disease)    mild  . Dyslipidemia   . Family history of heart disease   . History of nuclear stress test 05/2007   bruce myoview; normal pattern of perfusion in all regions, post-stress EF 74%, normal low risk study   . Hyperlipidemia   . Impaired glucose tolerance     Past Surgical History:  Procedure Laterality Date  . APPENDECTOMY  10/2004   exploratory lap, resection of Meckel's diverticulum (Dr. Biagio BorgM. Leone)   . PARATHYROIDECTOMY    . suprapubic incision     ? volvulus   . TRANSTHORACIC ECHOCARDIOGRAM  2003   EF 60%, trivial TR    FAMHx:  Family History  Problem Relation Age of Onset  . Asthma Father     deceased at age 61  . Heart disease Father 1050  . Kidney disease Sister     SOCHx:   reports that he quit smoking about 18 years ago. He has never used smokeless tobacco. He reports that he drinks alcohol. He reports that he does not use drugs.  ALLERGIES:  No Known Allergies  ROS: A comprehensive review of systems was negative.  HOME MEDS: Current Outpatient Prescriptions  Medication Sig Dispense Refill  . acyclovir (ZOVIRAX) 800 MG tablet as needed.     Marland Kitchen. albuterol (VENTOLIN HFA) 108 (90 Base)  MCG/ACT inhaler Inhale 2 puffs into the lungs 4 (four) times daily as needed. rescue 1 Inhaler prn  . aspirin 81 MG tablet Take 81 mg by mouth daily.      . cetirizine (ZYRTEC) 10 MG tablet Take 10 mg by mouth daily as needed for allergies.    Marland Kitchen. nystatin-triamcinolone (MYCOLOG II) cream Apply 1 application topically as needed.    . rosuvastatin (CRESTOR) 5 MG tablet Take 1 tablet (5 mg total) by mouth daily. 90 tablet 3   No current facility-administered medications for this visit.     LABS/IMAGING: No results found for this or any previous visit (from the past 48 hour(s)). No results found.  VITALS: BP (!) 148/80   Pulse 74   Ht  5\' 11"  (1.803 m)   Wt 192 lb (87.1 kg)   BMI 26.78 kg/m   EXAM: General appearance: alert and no distress Neck: no carotid bruit and no JVD Lungs: clear to auscultation bilaterally Heart: regular rate and rhythm, S1, S2 normal, no murmur, click, rub or gallop Abdomen: soft, non-tender; bowel sounds normal; no masses,  no organomegaly Extremities: extremities normal, atraumatic, no cyanosis or edema Pulses: 2+ and symmetric Skin: Skin color, texture, turgor normal. No rashes or lesions Neurologic: Grossly normal Psych: Mood, affect normal  EKG: Normal sinus rhythm at 74  ASSESSMENT: 1. Dyslipidemia 2. Family history of premature coronary disease  PLAN: 1.   Craig Townsend is doing well and continues to exercise without any difficulties. EKG appears stable compared to his prior study. He denies any chest pain or worsening shortness of breath. He is due for repeat lipid profile. I'll order that today and will continue Crestor and make any changes as necessary.  Follow-up annually or sooner as necessary.  Chrystie NoseKenneth C. Azzam Mehra, MD, [facility] CenterFACC Attending Cardiologist [facility]  Chrystie NoseKenneth C Lolah Coghlan 04/25/2016, 4:56 PM

## 2016-06-25 NOTE — Addendum Note (Signed)
Addended by: Fabiola BackerBROOME, MICHELLE L on: 06/25/2016 08:00 AM   Modules accepted: Orders

## 2017-05-31 ENCOUNTER — Telehealth: Payer: Self-pay | Admitting: Internal Medicine

## 2017-05-31 NOTE — Telephone Encounter (Signed)
Called and spoke with pt. Pt states he developed a sore throat & headache yesterday.  Denies any other symptoms.  Pt was asked if he had contacted his PCP regarding symptoms, pt states he typically sees CY regarding symptoms.   CY please advise. Thanks.   Current Outpatient Medications on File Prior to Visit  Medication Sig Dispense Refill  . acyclovir (ZOVIRAX) 800 MG tablet as needed.     Marland Kitchen. albuterol (VENTOLIN HFA) 108 (90 Base) MCG/ACT inhaler Inhale 2 puffs into the lungs 4 (four) times daily as needed. rescue 1 Inhaler prn  . aspirin 81 MG tablet Take 81 mg by mouth daily.      . cetirizine (ZYRTEC) 10 MG tablet Take 10 mg by mouth daily as needed for allergies.    Marland Kitchen. nystatin-triamcinolone (MYCOLOG II) cream Apply 1 application topically as needed.    . rosuvastatin (CRESTOR) 5 MG tablet Take 1 tablet (5 mg total) by mouth daily. 90 tablet 3   No current facility-administered medications on file prior to visit.     No Known Allergies

## 2017-05-31 NOTE — Telephone Encounter (Signed)
Per CY verbally- vial and head cold Recommends to stay well hydrated Aspirin or Tylenol as needed.   Gargle salt water.   Pt is aware of recommendation and voiced his understanding.  Nothing further is needed.

## 2017-10-21 ENCOUNTER — Encounter: Payer: Self-pay | Admitting: Internal Medicine

## 2017-10-21 ENCOUNTER — Ambulatory Visit: Payer: 59 | Admitting: Internal Medicine

## 2017-10-21 VITALS — BP 134/82 | HR 69 | Ht 71.0 in | Wt 197.6 lb

## 2017-10-21 DIAGNOSIS — Z8249 Family history of ischemic heart disease and other diseases of the circulatory system: Secondary | ICD-10-CM | POA: Diagnosis not present

## 2017-10-21 DIAGNOSIS — E785 Hyperlipidemia, unspecified: Secondary | ICD-10-CM | POA: Diagnosis not present

## 2017-10-21 MED ORDER — ROSUVASTATIN CALCIUM 5 MG PO TABS: 5.0000 mg | ORAL_TABLET | Freq: Every day | ORAL | 3 refills | Status: DC

## 2017-10-21 NOTE — Progress Notes (Signed)
OFFICE NOTE  Chief Complaint:  Routine follow-up  Primary Care Physician: [name], L.Craig Saucer, MD  HPI:  Craig Townsend  is a 62 year old gentleman here for an annual check-up. He has a history of dyslipidemia and mild coronary disease. We have been medically managing him, and he had actually a fairly good NMR profile last year with a particle number of 1080. LDL was 52 on Crestor 5 at bedtime. Unfortunately, he has impaired fasting glucose and with a two-hour glucose tolerance test, the glucose peaked at 196, down to 141. He has not yet required medications for that. Overall the past year, he has had no worsening shortness of breath, chest pain, palpitations, presyncope, or syncopal symptoms. He has been exercising and reported that his fasting glucose is now down to 90. He continues to take Crestor without any difficulty and is due for a lipid profile.  Also of note he was recently diagnosed with a right inguinal hernia which is considered small and he was not advised to undergo surgical repair.  Craig Townsend has no new complaints. He denies any chest pain or worsening shortness of breath. Continues to be quite active.  I saw Craig Townsend back in the office today. He apparently had a recent EKG which there was poor R-wave progression in the septal leads concerning for possible septal infarct. I felt that this likely was recently lead placement. We repeated the EKG in the office today and there is no evidence of prior MI. He's also had no symptoms to suggest that he's had a coronary event. The fact he is doing quite well. I did provide a letter to his employer regarding of the abnormalities on his EKG which I think are artifactual.  04/24/2016  Craig Townsend is seen today in follow-up. Over the past year he's done well. He denies any chest pain or worsening shortness of breath. He is due for repeat lipid profile. Blood pressure was mildly elevated today however came down to 130/78. Continues to be  physically active without any restrictions.  10/21/2017  Craig Townsend returns today for follow-up.  In general he feels like he is doing well.  He denies chest pain or shortness of breath.  He is overdue for a lipid profile.  Blood pressure is noted to be reasonably controlled today 134/82.  He has gained a little bit of weight.  He reports decreased activity.  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  EKG today shows sinus rhythm.  PMHx:  Past Medical History:  Diagnosis Date  . Acute sinusitis   . Asthma   . CAD (coronary artery disease)    mild  . Dyslipidemia   . Family history of heart disease   . History of nuclear stress test 05/2007   bruce myoview; normal pattern of perfusion in all regions, post-stress EF 74%, normal low risk study   . Hyperlipidemia   . Impaired glucose tolerance     Past Surgical History:  Procedure Laterality Date  . APPENDECTOMY  10/2004   exploratory lap, resection of Meckel's diverticulum (Dr. Biagio Borg)   . PARATHYROIDECTOMY    . suprapubic incision     ? volvulus   . TRANSTHORACIC ECHOCARDIOGRAM  2003   EF 60%, trivial TR    FAMHx:  Family History  Problem Relation Age of Onset  . Asthma Father        deceased at age 78  . Heart disease Father 15  .  Kidney disease Sister     SOCHx:   reports that he quit smoking about 19 years ago. He has never used smokeless tobacco. He reports that he drinks alcohol. He reports that he does not use drugs.  ALLERGIES:  No Known Allergies  ROS: A comprehensive review of systems was negative.  HOME MEDS: Current Outpatient Medications  Medication Sig Dispense Refill  . albuterol (VENTOLIN HFA) 108 (90 Base) MCG/ACT inhaler Inhale 2 puffs into the lungs 4 (four) times daily as needed. rescue 1 Inhaler prn  . aspirin 81 MG tablet Take 81 mg by mouth daily.      . cetirizine (ZYRTEC) 10 MG tablet Take 10 mg by mouth daily as needed for allergies.    Marland Kitchen  nystatin-triamcinolone (MYCOLOG II) cream Apply 1 application topically as needed.    . rosuvastatin (CRESTOR) 5 MG tablet Take 1 tablet (5 mg total) by mouth daily. 90 tablet 3   No current facility-administered medications for this visit.     LABS/IMAGING: No results found for this or any previous visit (from the past 48 hour(s)). No results found.  VITALS: BP 134/82   Pulse 69   Ht  (1.803 m)   Wt 197 lb 9.6 oz (89.6 kg)   BMI 27.56 kg/m   EXAM: General appearance: alert and no distress Neck: no carotid bruit and no JVD Lungs: clear to auscultation bilaterally Heart: regular rate and rhythm, S1, S2 normal, no murmur, click, rub or gallop Abdomen: soft, non-tender; bowel sounds normal; no masses,  no organomegaly Extremities: extremities normal, atraumatic, no cyanosis or edema Pulses: 2+ and symmetric Skin: Skin color, texture, turgor normal. No rashes or lesions Neurologic: Grossly normal Psych: Mood, affect normal  EKG: Normal sinus rhythm at 62 -personally reviewed  ASSESSMENT: 1. Dyslipidemia 2. Family history of premature coronary disease  PLAN: 1.   Craig Townsend continues to do well without any anginal symptoms.  He has dyslipidemia and needs a repeat lipid profile.  Blood pressure is well controlled.  I have encouraged more exercise and active weight loss.  Otherwise continue current medications.  Plan to see him back annually or sooner as necessary.  Chrystie Nose, MD, [facility], FACP  [place]  [facility]  Medical Director of the Advanced Lipid Disorders &  Cardiovascular Risk Reduction Clinic Diplomate of the American Board of Clinical Lipidology Attending Cardiologist  Direct Dial: [phone]  Fax: [phone]  Website:  [name] 10/21/2017, 10:59 AM

## 2017-10-21 NOTE — Patient Instructions (Signed)
Medication Instructions:   RESUME crestor  daily  Labwork:  FASTING LABS IN 3 MONTHS to check cholesterol  Testing/Procedures:  NONE  Follow-Up:  Your physician wants you to follow-up in: ONE YEAR with Dr. Rennis Golden. You will receive a reminder letter in the mail two months in advance. If you don't receive a letter, please call our office to schedule the follow-up appointment.   If you need a refill on your cardiac medications before your next appointment, please call your pharmacy.  Any Other Special Instructions Will Be Listed Below (If Applicable).

## 2018-11-20 ENCOUNTER — Other Ambulatory Visit: Payer: Self-pay | Admitting: Internal Medicine

## 2018-11-20 DIAGNOSIS — Z8249 Family history of ischemic heart disease and other diseases of the circulatory system: Secondary | ICD-10-CM

## 2018-11-20 DIAGNOSIS — E785 Hyperlipidemia, unspecified: Secondary | ICD-10-CM

## 2018-12-10 ENCOUNTER — Other Ambulatory Visit: Payer: Self-pay

## 2018-12-10 ENCOUNTER — Other Ambulatory Visit: Payer: 59

## 2018-12-10 DIAGNOSIS — Z20822 Contact with and (suspected) exposure to covid-19: Secondary | ICD-10-CM

## 2018-12-15 LAB — NOVEL CORONAVIRUS, NAA: SARS-CoV-2, NAA: NOT DETECTED

## 2019-02-03 ENCOUNTER — Ambulatory Visit: Payer: 59 | Admitting: Internal Medicine

## 2019-02-03 ENCOUNTER — Encounter: Payer: Self-pay | Admitting: Internal Medicine

## 2019-02-03 ENCOUNTER — Other Ambulatory Visit: Payer: Self-pay

## 2019-02-03 VITALS — BP 120/74 | HR 72 | Ht 71.0 in | Wt 195.4 lb

## 2019-02-03 DIAGNOSIS — E785 Hyperlipidemia, unspecified: Secondary | ICD-10-CM

## 2019-02-03 DIAGNOSIS — Z8249 Family history of ischemic heart disease and other diseases of the circulatory system: Secondary | ICD-10-CM

## 2019-02-03 LAB — LIPID PANEL
Chol/HDL Ratio: 3.8 ratio (ref 0.0–5.0)
Cholesterol, Total: 207 mg/dL — ABNORMAL HIGH (ref 100–199)
HDL: 54 mg/dL (ref >39)
LDL Chol Calc (NIH): 137 mg/dL — ABNORMAL HIGH (ref 0–99)
Triglycerides: 88 mg/dL (ref 0–149)
VLDL Cholesterol Cal: 16 mg/dL (ref 5–40)

## 2019-02-03 NOTE — Progress Notes (Signed)
OFFICE NOTE  Chief Complaint:  Routine follow-up  Primary Care Physician: Alroy Dust, L.Marlou Sa, MD  HPI:  Craig Townsend  is a 63 year old gentleman here for an annual check-up. He has a history of dyslipidemia and mild coronary disease. We have been medically managing him, and he had actually a fairly good NMR profile last year with a particle number of 1080. LDL was 52 on Crestor 5 at bedtime. Unfortunately, he has impaired fasting glucose and with a two-hour glucose tolerance test, the glucose peaked at 196, down to 141. He has not yet required medications for that. Overall the past year, he has had no worsening shortness of breath, chest pain, palpitations, presyncope, or syncopal symptoms. He has been exercising and reported that his fasting glucose is now down to 90. He continues to take Crestor without any difficulty and is due for a lipid profile.  Also of note he was recently diagnosed with a right inguinal hernia which is considered small and he was not advised to undergo surgical repair.  Craig Townsend has no new complaints. He denies any chest pain or worsening shortness of breath. Continues to be quite active.  I saw Craig Townsend back in the office today. He apparently had a recent EKG which there was poor R-wave progression in the septal leads concerning for possible septal infarct. I felt that this likely was recently lead placement. We repeated the EKG in the office today and there is no evidence of prior MI. He's also had no symptoms to suggest that he's had a coronary event. The fact he is doing quite well. I did provide a letter to his employer regarding of the abnormalities on his EKG which I think are artifactual.  04/24/2016  Craig Townsend is seen today in follow-up. Over the past year he's done well. He denies any chest pain or worsening shortness of breath. He is due for repeat lipid profile. Blood pressure was mildly elevated today however came down to 130/78. Continues to be  physically active without any restrictions.  10/21/2017  Craig Townsend returns today for follow-up.  In general he feels like he is doing well.  He denies chest pain or shortness of breath.  He is overdue for a lipid profile.  Blood pressure is noted to be reasonably controlled today 134/82.  He has gained a little bit of weight.  He reports decreased activity.  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  EKG today shows sinus rhythm.  02/03/2019  Craig Townsend is seen today in follow-up.  Overall he continues to do well.  Denies any chest pain or worsening shortness of breath.  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  He denies any palpitations though was noted to have some PVCs on his EKG today.  He is on low-dose Crestor.  He also takes daily aspirin.  He does have a family history of premature coronary disease however has had no notable early onset heart disease.  We discussed possibly re-stratifying him further with coronary artery calcium scoring.  He seemed interested in that today.  PMHx:  Past Medical History:  Diagnosis Date  . Acute sinusitis   . Asthma   . CAD (coronary artery disease)    mild  . Dyslipidemia   . Family history of heart disease   . History of nuclear stress test 05/2007   bruce myoview; normal pattern of perfusion in  all regions, post-stress EF 74%, normal low risk study   . Hyperlipidemia   . Impaired glucose tolerance     Past Surgical History:  Procedure Laterality Date  . APPENDECTOMY  10/2004   exploratory lap, resection of Meckel's diverticulum (Dr. Biagio BorgM. Leone)   . PARATHYROIDECTOMY    . suprapubic incision     ? volvulus   . TRANSTHORACIC ECHOCARDIOGRAM  2003   EF 60%, trivial TR    FAMHx:  Family History  Problem Relation Age of Onset  . Asthma Father        deceased at age 777  . Heart disease Father 350  . Kidney disease Sister     SOCHx:   reports that he quit smoking about  20 years ago. He has never used smokeless tobacco. He reports current alcohol use. He reports that he does not use drugs.  ALLERGIES:  No Known Allergies  ROS: A comprehensive review of systems was negative.  HOME MEDS: Current Outpatient Medications  Medication Sig Dispense Refill  . albuterol (VENTOLIN HFA) 108 (90 Base) MCG/ACT inhaler Inhale 2 puffs into the lungs 4 (four) times daily as needed. rescue 1 Inhaler prn  . aspirin 81 MG tablet Take 81 mg by mouth daily.      . cetirizine (ZYRTEC) 10 MG tablet Take 10 mg by mouth daily as needed for allergies.    Marland Kitchen. nystatin-triamcinolone (MYCOLOG II) cream Apply 1 application topically as needed.    . rosuvastatin (CRESTOR) 5 MG tablet TAKE 1 TABLET BY MOUTH  DAILY 90 tablet 3   No current facility-administered medications for this visit.     LABS/IMAGING: No results found for this or any previous visit (from the past 48 hour(s)). No results found.  VITALS: BP 120/74   Pulse 72   Ht 5\' 11"  (1.803 m)   Wt 195 lb 6.4 oz (88.6 kg)   BMI 27.25 kg/m   EXAM: General appearance: alert and no distress Neck: no carotid bruit and no JVD Lungs: clear to auscultation bilaterally Heart: regular rate and rhythm, S1, S2 normal, no murmur, click, rub or gallop Abdomen: soft, non-tender; bowel sounds normal; no masses,  no organomegaly Extremities: extremities normal, atraumatic, no cyanosis or edema Pulses: 2+ and symmetric Skin: Skin color, texture, turgor normal. No rashes or lesions Neurologic: Grossly normal Psych: Mood, affect normal  EKG: Sinus rhythm with PVCs at 72-personally reviewed  ASSESSMENT: 1. Dyslipidemia 2. Family history of premature coronary disease  PLAN: 1.   Mr. Craig Townsend remains asymptomatic and has had well-controlled dyslipidemia.  He is due for repeat lipid profile which we will order today.  Also they could be helpful to re-stratify him further with coronary calcium scoring.  Because he has a family history  of early onset heart disease and has had longstanding dyslipidemia which we managed medically, be helpful to determine whether or not we been successful at preventing coronary disease.  He is agreeable to calcium scoring.  I will contact him with those results but otherwise plan to see him back annually or sooner as necessary.  Chrystie NoseKenneth C. Linda Biehn, MD, [facility] HospitalFACC, FACP  [place]  [facility]  Medical Director of the Advanced Lipid Disorders &  Cardiovascular Risk Reduction Clinic Diplomate of the American Board of Clinical Lipidology Attending Cardiologist  Direct Dial: [phone]  Fax: [phone]  Website:  [email or web address]   Felisia Balcom C Neera Teng 02/03/2019, 9:16 AM

## 2019-02-03 NOTE — Patient Instructions (Signed)
Medication Instructions:  NO CHANGES If you need a refill on your cardiac medications before your next appointment, please call your pharmacy.   Lab work: FASTING lab work   If you have labs (blood work) drawn today and your tests are completely normal, you will receive your results only by: Marland Kitchen MyChart Message (if you have MyChart) OR . A paper copy in the mail If you have any lab test that is abnormal or we need to change your treatment, we will call you to review the results.  Testing/Procedures: Dr. Debara Pickett has ordered a CT coronary calcium score. This test is done at [address]. This is $150 out of pocket.   Coronary CalciumScan A coronary calcium scan is an imaging test used to look for deposits of calcium and other fatty materials (plaques) in the inner lining of the blood vessels of the heart (coronary arteries). These deposits of calcium and plaques can partly clog and narrow the coronary arteries without producing any symptoms or warning signs. This puts a person at risk for a heart attack. This test can detect these deposits before symptoms develop. Tell a health care provider about:  Any allergies you have.  All medicines you are taking, including vitamins, herbs, eye drops, creams, and over-the-counter medicines.  Any problems you or family members have had with anesthetic medicines.  Any blood disorders you have.  Any surgeries you have had.  Any medical conditions you have.  Whether you are pregnant or may be pregnant. What are the risks? Generally, this is a safe procedure. However, problems may occur, including:  Harm to a pregnant woman and her unborn baby. This test involves the use of radiation. Radiation exposure can be dangerous to a pregnant woman and her unborn baby. If you are pregnant, you generally should not have this procedure done.  Slight increase in the risk of cancer. This is because of the radiation involved in the test. What  happens before the procedure? No preparation is needed for this procedure. What happens during the procedure?  You will undress and remove any jewelry around your neck or chest.  You will put on a hospital gown.  Sticky electrodes will be placed on your chest. The electrodes will be connected to an electrocardiogram (ECG) machine to record a tracing of the electrical activity of your heart.  A CT scanner will take pictures of your heart. During this time, you will be asked to lie still and hold your breath for 2-3 seconds while a picture of your heart is being taken. The procedure may vary among health care providers and hospitals. What happens after the procedure?  You can get dressed.  You can return to your normal activities.  It is up to you to get the results of your test. Ask your health care provider, or the department that is doing the test, when your results will be ready. Summary  A coronary calcium scan is an imaging test used to look for deposits of calcium and other fatty materials (plaques) in the inner lining of the blood vessels of the heart (coronary arteries).  Generally, this is a safe procedure. Tell your health care provider if you are pregnant or may be pregnant.  No preparation is needed for this procedure.  A CT scanner will take pictures of your heart.  You can return to your normal activities after the scan is done. This information is not intended to replace advice given to you by your  health care provider. Make sure you discuss any questions you have with your health care provider. Document Released: 11/17/2007 Document Revised: 04/09/2016 Document Reviewed: 04/09/2016 Elsevier Interactive Patient Education  2017 [employer]    Follow-Up: [facility], you and your health needs are our priority.  As part of our continuing mission to provide you with exceptional heart care, we have created designated Provider Care Teams.  These Care Teams  include your primary Cardiologist (physician) and Advanced Practice Providers (APPs -  Physician Assistants and Nurse Practitioners) who all work together to provide you with the care you need, when you need it. You will need a follow up appointment in 12 months.  Please call our office 2 months in advance to schedule this appointment.  You may see Dr. Rennis GoldenHilty or one of the following Advanced Practice Providers on your designated Care Team: Azalee CourseHao Meng, [place] JerseyPA-C . Micah FlesherAngela Duke, PA-C

## 2019-03-04 ENCOUNTER — Other Ambulatory Visit: Payer: Self-pay

## 2019-03-04 ENCOUNTER — Ambulatory Visit (INDEPENDENT_AMBULATORY_CARE_PROVIDER_SITE_OTHER)
Admission: RE | Admit: 2019-03-04 | Discharge: 2019-03-04 | Disposition: A | Discharge status: Home / Self Care | Payer: Self-pay | Source: Ambulatory Visit | Attending: Internal Medicine | Admitting: Internal Medicine

## 2019-03-04 DIAGNOSIS — E785 Hyperlipidemia, unspecified: Secondary | ICD-10-CM

## 2019-03-06 ENCOUNTER — Telehealth: Payer: Self-pay | Admitting: Internal Medicine

## 2019-03-06 DIAGNOSIS — E785 Hyperlipidemia, unspecified: Secondary | ICD-10-CM

## 2019-03-06 DIAGNOSIS — Z8249 Family history of ischemic heart disease and other diseases of the circulatory system: Secondary | ICD-10-CM

## 2019-03-06 MED ORDER — ROSUVASTATIN CALCIUM 40 MG PO TABS: 40.0000 mg | ORAL_TABLET | Freq: Every day | ORAL | 3 refills | Status: DC

## 2019-03-06 NOTE — Telephone Encounter (Signed)
Patient called with CT calcium score results and MD recommendations to increase crestor to 40mg  daily. Scheduled for follow up on 06/02/19 and he will have lab work prior to this visit.

## 2019-03-25 ENCOUNTER — Other Ambulatory Visit: Payer: Self-pay

## 2019-03-25 DIAGNOSIS — Z20822 Contact with and (suspected) exposure to covid-19: Secondary | ICD-10-CM

## 2019-03-26 LAB — NOVEL CORONAVIRUS, NAA: SARS-CoV-2, NAA: NOT DETECTED

## 2019-04-27 ENCOUNTER — Other Ambulatory Visit: Payer: Self-pay | Admitting: Internal Medicine

## 2019-04-27 DIAGNOSIS — E785 Hyperlipidemia, unspecified: Secondary | ICD-10-CM

## 2019-06-02 ENCOUNTER — Ambulatory Visit: Payer: 59 | Admitting: Internal Medicine

## 2019-08-14 ENCOUNTER — Ambulatory Visit: Payer: 59 | Attending: Internal Medicine

## 2019-08-14 DIAGNOSIS — Z23 Encounter for immunization: Secondary | ICD-10-CM

## 2019-08-14 NOTE — Progress Notes (Signed)
   Covid-19 Vaccination Clinic  Name:  Craig Townsend    MRN: [id number] DOB: 1955-08-30  08/14/2019  [name] was observed post Covid-19 immunization for 15 minutes without incident. He was provided with Vaccine Information Sheet and instruction to access the V-Safe system.   [name] was instructed to call 911 with any severe reactions post vaccine: Marland Kitchen Difficulty breathing  . Swelling of face and throat  . A fast heartbeat  . A bad rash all over body  . Dizziness and weakness   Immunizations Administered    Name Date Dose VIS Date Route   Moderna COVID-19 Vaccine 08/14/2019  9:54 AM 0.5 mL 05/05/2019 Intramuscular   Manufacturer: Moderna   Lot: [id number]   NDC: 48628-241-75

## 2019-09-16 ENCOUNTER — Ambulatory Visit: Payer: 59 | Attending: Internal Medicine

## 2019-09-16 DIAGNOSIS — Z23 Encounter for immunization: Secondary | ICD-10-CM

## 2019-09-16 NOTE — Progress Notes (Signed)
   Covid-19 Vaccination Clinic  Name:  Craig Townsend    MRN: [id number] DOB: [birth year]  09/16/2019  [name] was observed post Covid-19 immunization for 15 minutes without incident. He was provided with Vaccine Information Sheet and instruction to access the V-Safe system.   [name] was instructed to call 911 with any severe reactions post vaccine: Marland Kitchen Difficulty breathing  . Swelling of face and throat  . A fast heartbeat  . A bad rash all over body  . Dizziness and weakness   Immunizations Administered    Name Date Dose VIS Date Route   Moderna COVID-19 Vaccine 09/16/2019 10:03 AM 0.5 mL 05/05/2019 Intramuscular   Manufacturer: Moderna   Lot: [id number]   NDC: 64332-951-88

## 2019-10-14 ENCOUNTER — Other Ambulatory Visit: Payer: Self-pay | Admitting: Internal Medicine

## 2019-10-15 MED ORDER — ROSUVASTATIN CALCIUM 40 MG PO TABS: 40.0000 mg | ORAL_TABLET | Freq: Every day | ORAL | 3 refills | Status: DC

## 2019-10-30 ENCOUNTER — Other Ambulatory Visit: Payer: Self-pay | Admitting: Internal Medicine

## 2020-01-25 IMAGING — CT CT HEART SCORING
2 series · 15 of 20 positions shown, 18 images · non-contrast
Comparison: None.
COMPARISON: None.

Addendum:
EXAM:
OVER-READ INTERPRETATION  CT CHEST

The following report is an over-read performed by radiologist Dr.
Roel Martini [REDACTED] on 03/04/2019. This over-read
does not include interpretation of cardiac or coronary anatomy or
pathology. The coronary calcium score interpretation by the
cardiologist is attached.
CLINICAL DATA: 63 yo male with dyslipidemia for risk stratification
Coronary Calcium Score
TECHNIQUE: The patient was scanned on a Siemens Force scanner. Axial
non-contrast 3 mm slices were carried out through the heart. The
data set was analyzed on a dedicated work station and scored using
the Agatson method.

[Series 2: casc 3.0 i36f 2 bestdiast 65 % · axial · 0.36mm/px · z∈[-280,-166]mm · 9 of 48 slices shown, 12 images]
[im 5/48  vessel]
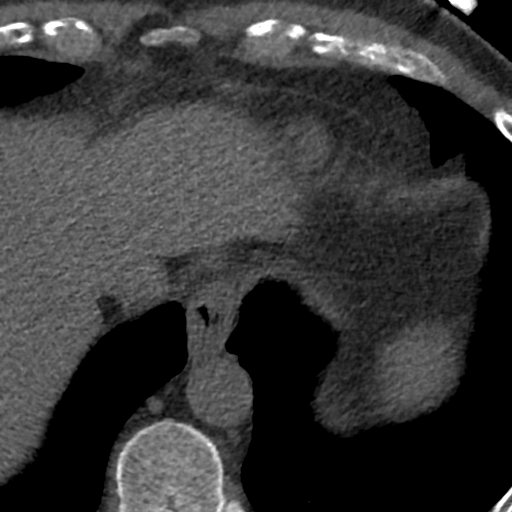
[im 5/48  lung]
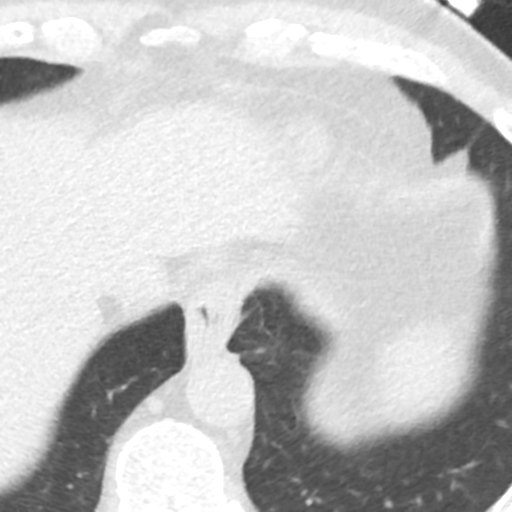
[im 10/48  vessel]
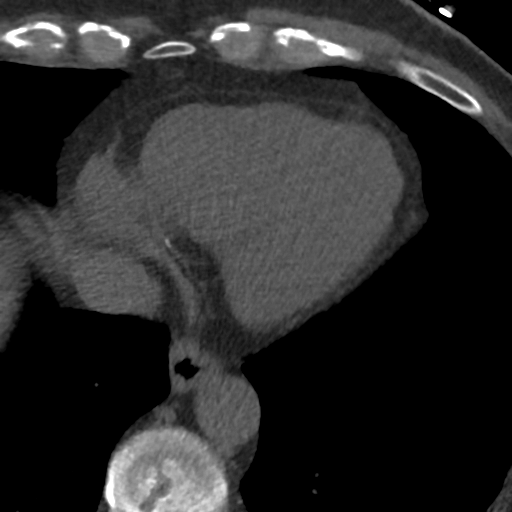
[im 15/48  vessel]
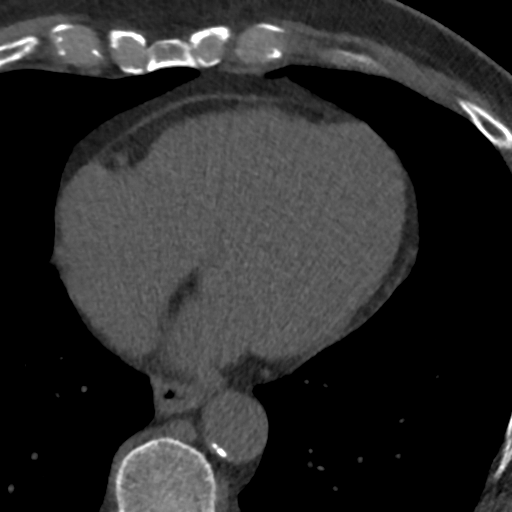
[im 19/48  vessel]
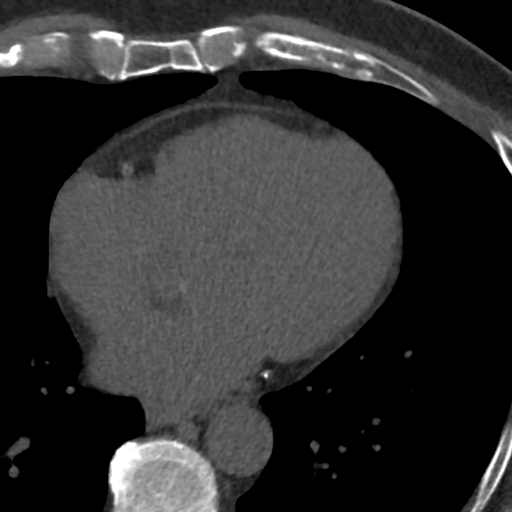
[im 24/48  vessel]
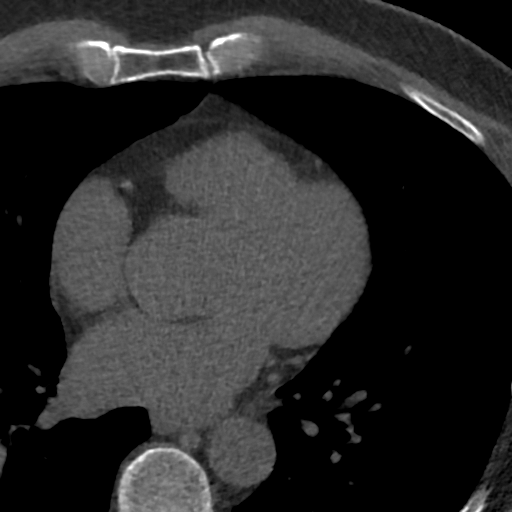
[im 24/48  lung]
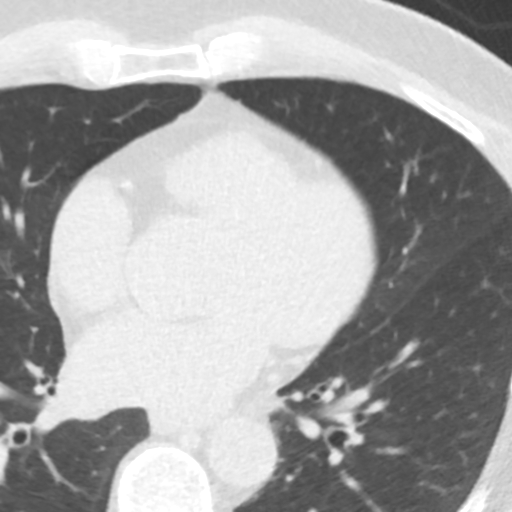
[im 29/48  vessel]
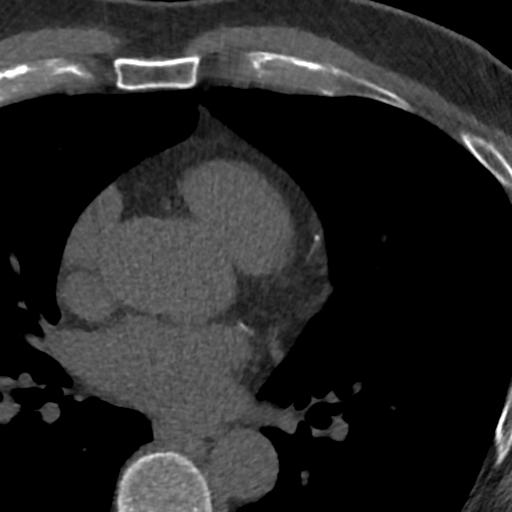
[im 33/48  vessel]
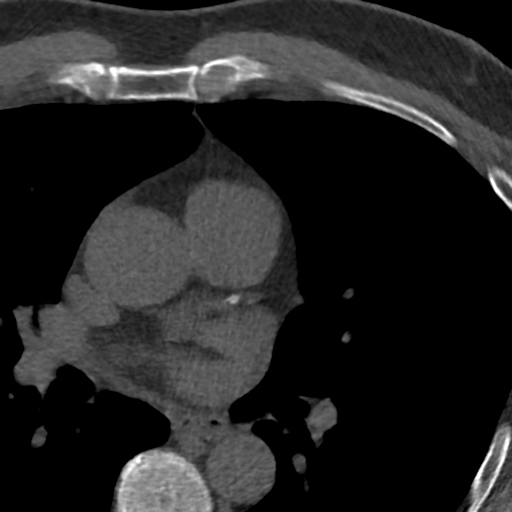
[im 38/48  vessel]
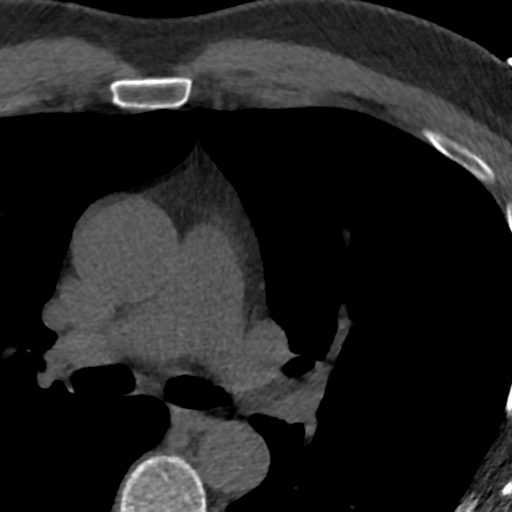
[im 43/48  vessel]
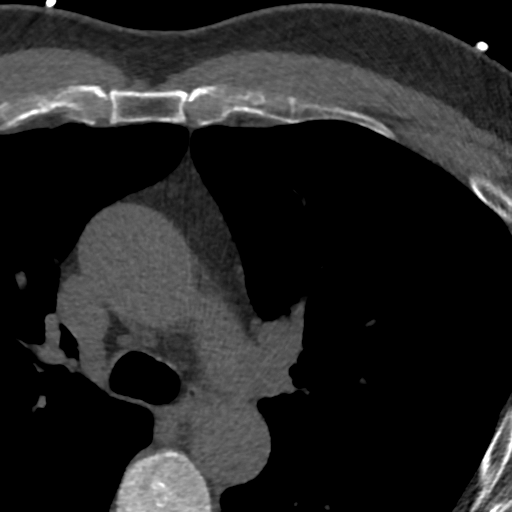
[im 43/48  lung]
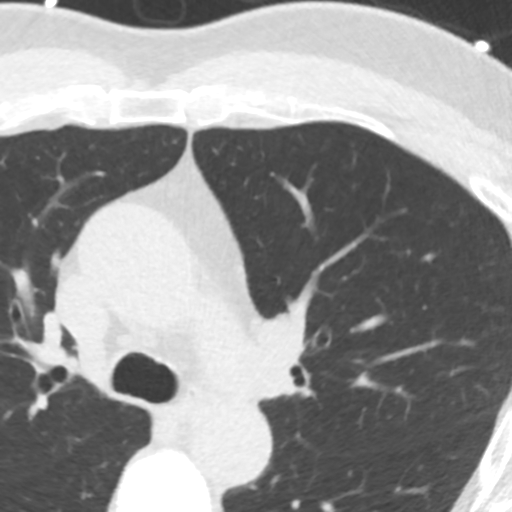

[Series 4: lung st 68 % · axial · 0.70mm/px · z∈[-280,-211]mm · 6 of 37 slices shown]
[im 5/37  lung]
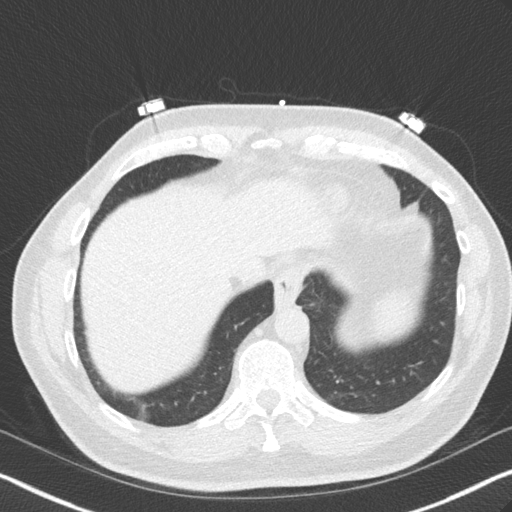
[im 10/37  lung]
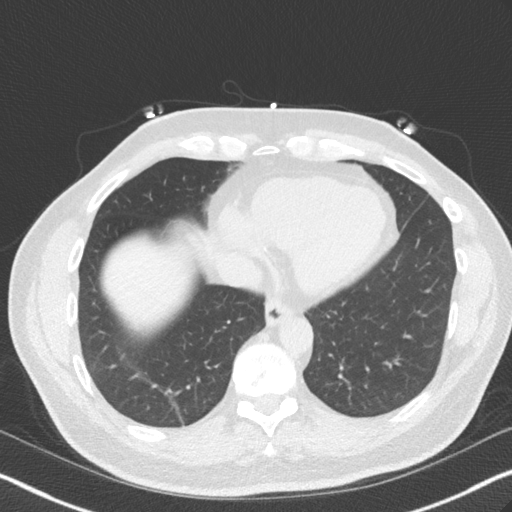
[im 14/37  lung]
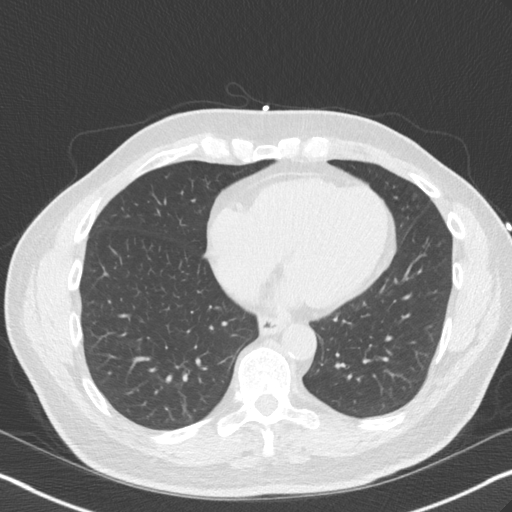
[im 19/37  lung]
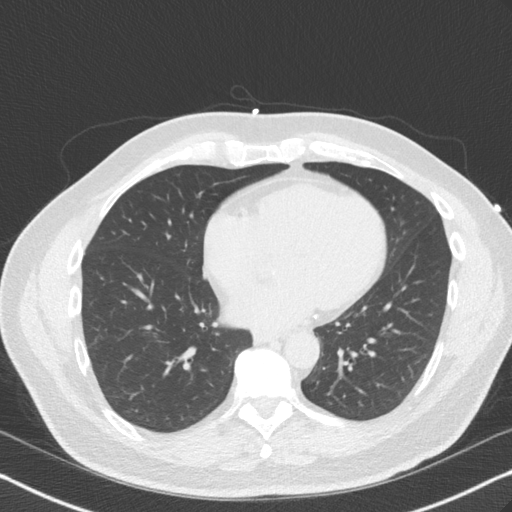
[im 23/37  lung]
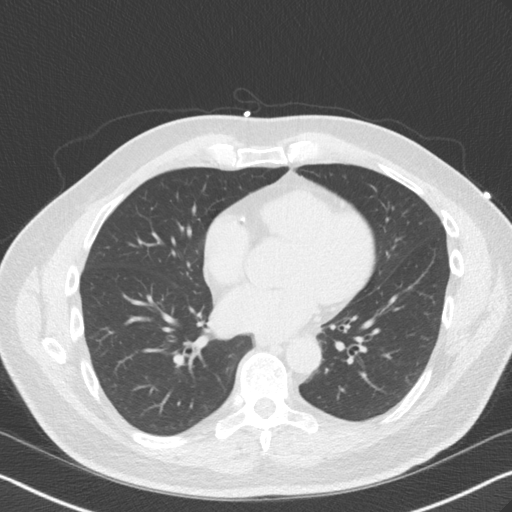
[im 28/37  lung]
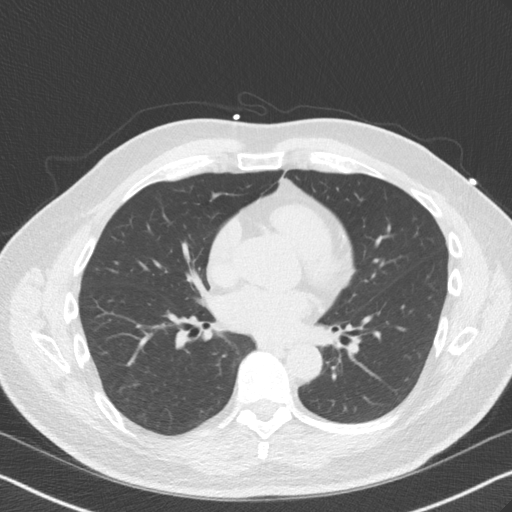

[15 of 20 positions shown; findings below may reference images not displayed]

FINDINGS: Vascular: Heart is normal size. Scattered aortic calcifications in
the distal aortic arch. Aorta is normal caliber.

Mediastinum/Nodes: No adenopathy in the lower mediastinum or hila.

Lungs/Pleura: Visualized lungs clear.  No effusions.

Upper Abdomen: Imaging into the upper abdomen shows no acute
findings.

Musculoskeletal: No acute bony abnormality.
IMPRESSION: No acute or significant extracardiac abnormality.
FINDINGS: Non-cardiac: See separate report from [REDACTED].

Ascending Aorta: Atherosclerotic calcification is noted. Aorta
measures 3.7 cm at the level of the PA bifurcation.

Pericardium: Normal

Coronary arteries: Normal origin. Multivessel calcification is
noted.
IMPRESSION: Coronary calcium score of 224.3. Multivessel calcification. This was
the 71st percentile for age and sex matched control.

*** End of Addendum ***
EXAM:
OVER-READ INTERPRETATION  CT CHEST

The following report is an over-read performed by radiologist Dr.
Roel Martini [REDACTED] on 03/04/2019. This over-read
does not include interpretation of cardiac or coronary anatomy or
pathology. The coronary calcium score interpretation by the
cardiologist is attached.
FINDINGS: Vascular: Heart is normal size. Scattered aortic calcifications in
the distal aortic arch. Aorta is normal caliber.

Mediastinum/Nodes: No adenopathy in the lower mediastinum or hila.

Lungs/Pleura: Visualized lungs clear.  No effusions.

Upper Abdomen: Imaging into the upper abdomen shows no acute
findings.

Musculoskeletal: No acute bony abnormality.
IMPRESSION: No acute or significant extracardiac abnormality.

## 2020-03-01 ENCOUNTER — Telehealth: Payer: Self-pay | Admitting: Internal Medicine

## 2020-03-01 NOTE — Telephone Encounter (Signed)
lvm for patient to return call to get follow up scheduled with Hilty from recall list 

## 2020-03-03 ENCOUNTER — Encounter: Payer: Self-pay | Admitting: General Practice

## 2020-03-14 ENCOUNTER — Telehealth: Payer: Self-pay | Admitting: Internal Medicine

## 2020-03-14 DIAGNOSIS — Z8249 Family history of ischemic heart disease and other diseases of the circulatory system: Secondary | ICD-10-CM

## 2020-03-14 DIAGNOSIS — E785 Hyperlipidemia, unspecified: Secondary | ICD-10-CM

## 2020-03-14 NOTE — Telephone Encounter (Signed)
Patient wanted to know if the set of lab orders he has will still be active for him. He has orders dated for 04/27/19 but does not have an appointment scheduled to see Dr. Rennis Golden until 06/30/20. Please advise

## 2020-03-14 NOTE — Telephone Encounter (Signed)
Unable to reach pt or leave a message mailbox is full. New lipid order mailed to the patient.

## 2020-06-24 LAB — LIPID PANEL
Chol/HDL Ratio: 3.1 ratio (ref 0.0–5.0)
Cholesterol, Total: 173 mg/dL (ref 100–199)
HDL: 55 mg/dL (ref >39)
LDL Chol Calc (NIH): 100 mg/dL — ABNORMAL HIGH (ref 0–99)
Triglycerides: 96 mg/dL (ref 0–149)
VLDL Cholesterol Cal: 18 mg/dL (ref 5–40)

## 2020-06-30 ENCOUNTER — Encounter: Payer: Self-pay | Admitting: Internal Medicine

## 2020-06-30 ENCOUNTER — Other Ambulatory Visit: Payer: Self-pay

## 2020-06-30 ENCOUNTER — Ambulatory Visit: Payer: Medicare Other | Admitting: Internal Medicine

## 2020-06-30 VITALS — BP 133/74 | HR 79 | Ht 71.0 in | Wt 200.6 lb

## 2020-06-30 DIAGNOSIS — Z8249 Family history of ischemic heart disease and other diseases of the circulatory system: Secondary | ICD-10-CM

## 2020-06-30 DIAGNOSIS — E785 Hyperlipidemia, unspecified: Secondary | ICD-10-CM

## 2020-06-30 MED ORDER — ROSUVASTATIN CALCIUM 20 MG PO TABS: 20.0000 mg | ORAL_TABLET | Freq: Every day | ORAL | 3 refills | Status: DC

## 2020-06-30 NOTE — Patient Instructions (Signed)
Medication Instructions:  INCREASE crestor to 20mg  daily  *If you need a refill on your cardiac medications before your next appointment, please call your pharmacy*   Lab Work: FASTING lipid panel in 3 months to check cholesterol   If you have labs (blood work) drawn today and your tests are completely normal, you will receive your results only by: MyChart Message (if you have MyChart) OR . A paper copy in the mail If you have any lab test that is abnormal or we need to change your treatment, we will call you to review the results.   Testing/Procedures: NONE   Follow-Up: At [facility], you and your health needs are our priority.  As part of our continuing mission to provide you with exceptional heart care, we have created designated Provider Care Teams.  These Care Teams include your primary Cardiologist (physician) and Advanced Practice Providers (APPs -  Physician Assistants and Nurse Practitioners) who all work together to provide you with the care you need, when you need it.  We recommend signing up for the patient portal called "MyChart".  Sign up information is provided on this After Visit Summary.  MyChart is used to connect with patients for Virtual Visits (Telemedicine).  Patients are able to view lab/test results, encounter notes, upcoming appointments, etc.  Non-urgent messages can be sent to your provider as well.   To learn more about what you can do with MyChart, go to [facility].    Your next appointment:   12 month(s) - lipid clinic  The format for your next appointment:   In Person  Provider:   [email or web address] Hilty, MD   Other Instructions

## 2020-06-30 NOTE — Progress Notes (Signed)
OFFICE NOTE  Chief Complaint:  Routine follow-up  Primary Care Physician: [name], L.[name], MD  HPI:  Craig Townsend  is a 65 year old gentleman here for an annual check-up. He has a history of dyslipidemia and mild coronary disease. We have been medically managing him, and he had actually a fairly good NMR profile last year with a particle number of 1080. LDL was 52 on Crestor 5 at bedtime. Unfortunately, he has impaired fasting glucose and with a two-hour glucose tolerance test, the glucose peaked at 196, down to 141. He has not yet required medications for that. Overall the past year, he has had no worsening shortness of breath, chest pain, palpitations, presyncope, or syncopal symptoms. He has been exercising and reported that his fasting glucose is now down to 90. He continues to take Crestor without any difficulty and is due for a lipid profile.  Also of note he was recently diagnosed with a right inguinal hernia which is considered small and he was not advised to undergo surgical repair.  Mr. Craig Townsend has no new complaints. He denies any chest pain or worsening shortness of breath. Continues to be quite active.  I saw Mr. Craig Townsend back in the office today. He apparently had a recent EKG which there was poor R-wave progression in the septal leads concerning for possible septal infarct. I felt that this likely was recently lead placement. We repeated the EKG in the office today and there is no evidence of prior MI. He's also had no symptoms to suggest that he's had a coronary event. The fact he is doing quite well. I did provide a letter to his employer regarding of the abnormalities on his EKG which I think are artifactual.  04/24/2016  Mr. Craig Townsend is seen today in follow-up. Over the past year he's done well. He denies any chest pain or worsening shortness of breath. He is due for repeat lipid profile. Blood pressure was mildly elevated today however came down to 130/78. Continues to be  physically active without any restrictions.  10/21/2017  Mr. Craig Townsend returns today for follow-up.  In general he feels like he is doing well.  He denies chest pain or shortness of breath.  He is overdue for a lipid profile.  Blood pressure is noted to be reasonably controlled today 134/82.  He has gained a little bit of weight.  He reports decreased activity.  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  EKG today shows sinus rhythm.  02/03/2019  Mr. Craig Townsend is seen today in follow-up.  Overall he continues to do well.  Denies any chest pain or worsening shortness of breath.  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  He denies any palpitations though was noted to have some PVCs on his EKG today.  He is on low-dose Crestor.  He also takes daily aspirin.  He does have a family history of premature coronary disease however has had no notable early onset heart disease.  We discussed possibly re-stratifying him further with coronary artery calcium scoring.  He seemed interested in that today.  06/30/2020  Mr Craig Townsend is seen today in follow-up. Is actually been a year and a half since I last saw him. He underwent calcium scoring in 2020. This demonstrated a calcium score of 224 which was 71st percentile for age and sex matched control. He is noted to more recently been compliant with daily dosing  of rosuvastatin. He is no longer on the 40 mg rather than 5 mg daily. Despite this he has had much better cholesterol. His recent blood work showed total cholesterol of 173, triglycerides 96, HDL 55 and LDL 100. Based on his higher coronary calcium score would advocate even more aggressive therapy to LDL less than 70.   PMHx:  Past Medical History:  Diagnosis Date  . Acute sinusitis   . Asthma   . CAD (coronary artery disease)    mild  . Dyslipidemia   . Family history of heart disease   . History of nuclear stress test 05/2007   bruce myoview;  normal pattern of perfusion in all regions, post-stress EF 74%, normal low risk study   . Hyperlipidemia   . Impaired glucose tolerance     Past Surgical History:  Procedure Laterality Date  . APPENDECTOMY  10/2004   exploratory lap, resection of Meckel's diverticulum (Dr. Biagio Borg)   . PARATHYROIDECTOMY    . suprapubic incision     ? volvulus   . TRANSTHORACIC ECHOCARDIOGRAM  2003   EF 60%, trivial TR    FAMHx:  Family History  Problem Relation Age of Onset  . Asthma Father        deceased at age 64  . Heart disease Father 22  . Kidney disease Sister     SOCHx:   reports that he quit smoking about 22 years ago. He has never used smokeless tobacco. He reports current alcohol use. He reports that he does not use drugs.  ALLERGIES:  No Known Allergies  ROS: A comprehensive review of systems was negative.  HOME MEDS: Current Outpatient Medications  Medication Sig Dispense Refill  . albuterol (VENTOLIN HFA) 108 (90 Base) MCG/ACT inhaler Inhale 2 puffs into the lungs 4 (four) times daily as needed. rescue 1 Inhaler prn  . aspirin 81 MG tablet Take 81 mg by mouth daily.    . cetirizine (ZYRTEC) 10 MG tablet Take 10 mg by mouth daily as needed for allergies.    Marland Kitchen nystatin-triamcinolone (MYCOLOG II) cream Apply 1 application topically as needed.    . rosuvastatin (CRESTOR) 5 MG tablet TAKE 1 TABLET BY MOUTH  DAILY 90 tablet 3  . rosuvastatin (CRESTOR) 40 MG tablet Take 1 tablet (40 mg total) by mouth daily. 90 tablet 3   No current facility-administered medications for this visit.    LABS/IMAGING: No results found for this or any previous visit (from the past 48 hour(s)). No results found.  VITALS: BP 133/74   Pulse 79   Ht 5\' 11"  (1.803 m)   Wt 200 lb 9.6 oz (91 kg)   SpO2 98%   BMI 27.98 kg/m   EXAM: General appearance: alert and no distress Neck: no carotid bruit and no JVD Lungs: clear to auscultation bilaterally Heart: regular rate and rhythm, S1, S2  normal, no murmur, click, rub or gallop Abdomen: soft, non-tender; bowel sounds normal; no masses,  no organomegaly Extremities: extremities normal, atraumatic, no cyanosis or edema Pulses: 2+ and symmetric Skin: Skin color, texture, turgor normal. No rashes or lesions Neurologic: Grossly normal Psych: Mood, affect normal  EKG: Deferred  ASSESSMENT: 1. Dyslipidemia 2. Family history of premature coronary disease 3. Elevated CAC score of 224 (71st %) - 02/2019  PLAN: 1.   Mr. Ryland did have an elevated coronary artery calcium score which was more than expected for age. I would advocate for more aggressive lipid therapy. So far he is doing well on  5 mg of rosuvastatin. He previously taken 40 mg but ran out of that and was not compliant with it is often is the 5 mg. I would recommend increasing up to 20 mg rosuvastatin with a plan repeat lipid in about 3 months. Follow-up with me to me annually or sooner as necessary.  Chrystie Nose, MD, [facility], FACP  [place]  [facility]  Medical Director of the Advanced Lipid Disorders &  Cardiovascular Risk Reduction Clinic Diplomate of the American Board of Clinical Lipidology Attending Cardiologist  Direct Dial: [phone]  Fax: [phone]  Website:  [email or web address] 06/30/2020, 8:09 AM

## 2020-10-04 ENCOUNTER — Other Ambulatory Visit: Payer: Self-pay

## 2020-10-04 ENCOUNTER — Ambulatory Visit
Admission: EM | Admit: 2020-10-04 | Discharge: 2020-10-04 | Disposition: A | Discharge status: Home / Self Care | Payer: Medicare Other | Attending: Family Medicine | Admitting: Family Medicine

## 2020-10-04 ENCOUNTER — Encounter: Payer: Self-pay | Admitting: Emergency Medicine

## 2020-10-04 DIAGNOSIS — J069 Acute upper respiratory infection, unspecified: Secondary | ICD-10-CM | POA: Diagnosis not present

## 2020-10-04 DIAGNOSIS — R030 Elevated blood-pressure reading, without diagnosis of hypertension: Secondary | ICD-10-CM

## 2020-10-04 MED ORDER — HYDROCOD POLST-CPM POLST ER 10-8 MG/5ML PO SUER: 5.0000 mL | Freq: Every evening | ORAL | 0 refills | Status: DC | PRN

## 2020-10-04 NOTE — Discharge Instructions (Addendum)
Be aware, your cough medication may cause drowsiness. Please do not drive, operate heavy machinery or make important decisions while on this medication, it can cloud your judgement.  Your blood pressure was noted to be elevated during your visit today. If you are currently taking medication for high blood pressure, please ensure you are taking this as directed. If you do not have a history of high blood pressure and your blood pressure remains persistently elevated, you may need to begin taking a medication at some point. You may return here within the next few days to recheck if unable to see your primary care provider or if you do not have a one.  BP (!) 153/84   Pulse 89   Temp 98.4 F (36.9 C) (Oral)   Resp 18   SpO2 97%   BP Readings from Last 3 Encounters:  10/04/20 (!) 153/84  06/30/20 133/74  02/03/19 120/74

## 2020-10-04 NOTE — ED Triage Notes (Signed)
Cough and headache since Saturday

## 2020-10-04 NOTE — ED Provider Notes (Signed)
[facility]   [id number] 10/04/20 Arrival Time: 1405  ASSESSMENT & PLAN:  1. Viral URI with cough   2. Elevated blood pressure reading without diagnosis of hypertension    Discussed typical duration of viral illnesses. OTC symptom care as needed.  Meds ordered this encounter  Medications  . chlorpheniramine-HYDROcodone (TUSSIONEX PENNKINETIC ER) 10-8 MG/5ML SUER    Sig: Take 5 mLs by mouth at bedtime as needed for cough.    Dispense:  90 mL    Refill:  0     Follow-up Information    Mitchell, L.[name], MD.   Specialty: Family Medicine Why: To recheck your blood pressure. Contact information: [address] [place] [address] [phone]                Discharge Instructions      Be aware, your cough medication may cause drowsiness. Please do not drive, operate heavy machinery or make important decisions while on this medication, it can cloud your judgement.  Your blood pressure was noted to be elevated during your visit today. If you are currently taking medication for high blood pressure, please ensure you are taking this as directed. If you do not have a history of high blood pressure and your blood pressure remains persistently elevated, you may need to begin taking a medication at some point. You may return here within the next few days to recheck if unable to see your primary care provider or if you do not have a one.  BP (!) 153/84   Pulse 89   Temp 98.4 F (36.9 C) (Oral)   Resp 18   SpO2 97%   BP Readings from Last 3 Encounters:  10/04/20 (!) 153/84  06/30/20 133/74  02/03/19 120/74        [place] Controlled Substances Registry consulted for this patient. I feel the risk/benefit ratio today is favorable for proceeding with this prescription for a controlled substance. Medication sedation precautions given.  Reviewed expectations re: course of current medical issues. Questions answered. Outlined signs and symptoms  indicating need for more acute intervention. Understanding verbalized. After Visit Summary given.   SUBJECTIVE: History from: patient. Craig Townsend is a 65 y.o. male who reports cough and HA and nasal congestion; over past 2-3 days; daughter with similar recently; just visited her. Denies: fever and difficulty breathing. Normal PO intake without n/v/d. Increased blood pressure noted today. Reports that he is not treated for HTN. He reports no chest pain on exertion, no dyspnea on exertion, no swelling of ankles and no palpitations.   OBJECTIVE:  Vitals:   10/04/20 1414  BP: (!) 153/84  Pulse: 89  Resp: 18  Temp: 98.4 F (36.9 C)  TempSrc: Oral  SpO2: 97%    General appearance: alert; no distress Eyes: PERRLA; EOMI; conjunctiva normal HENT: ; AT; with nasal congestion Neck: supple  Lungs: speaks full sentences without difficulty; unlabored; no wheezing; CTAB Extremities: no edema Skin: warm and dry Neurologic: normal gait Psychological: alert and cooperative; normal mood and affect   No Known Allergies  Past Medical History:  Diagnosis Date  . Acute sinusitis   . Asthma   . CAD (coronary artery disease)    mild  . Dyslipidemia   . Family history of heart disease   . History of nuclear stress test 05/2007   bruce myoview; normal pattern of perfusion in all regions, post-stress EF 74%, normal low risk study   . Hyperlipidemia   . Impaired glucose  tolerance    Social History   Socioeconomic History  . Marital status: Divorced    Spouse name: Not on file  . Number of children: Not on file  . Years of education: grad degr.  . Highest education level: Not on file  Occupational History  . Occupation: Retired    Comment: Emergency planning/management officer  . Occupation: Plainclothes baliff    Comment: Retail buyer: UNEMPLOYED  Tobacco Use  . Smoking status: Former Smoker    Quit date: 02/26/1998    Years since quitting: 22.6  . Smokeless tobacco: Never Used   Substance and Sexual Activity  . Alcohol use: Yes    Comment: occasionally  . Drug use: No  . Sexual activity: Not on file  Other Topics Concern  . Not on file  Social History Narrative  . Not on file   Social Determinants of Health   Financial Resource Strain: Not on file  Food Insecurity: Not on file  Transportation Needs: Not on file  Physical Activity: Not on file  Stress: Not on file  Social Connections: Not on file  Intimate Partner Violence: Not on file   Family History  Problem Relation Age of Onset  . Asthma Father        deceased at age 58  . Heart disease Father 37  . Kidney disease Sister    Past Surgical History:  Procedure Laterality Date  . APPENDECTOMY  10/2004   exploratory lap, resection of Meckel's diverticulum (Dr. Biagio Borg)   . PARATHYROIDECTOMY    . suprapubic incision     ? volvulus   . TRANSTHORACIC ECHOCARDIOGRAM  2003   EF 60%, trivial TR     Mardella Layman, MD 10/04/20 1526

## 2021-04-04 ENCOUNTER — Other Ambulatory Visit: Payer: Self-pay

## 2021-04-04 ENCOUNTER — Other Ambulatory Visit (HOSPITAL_COMMUNITY)
Admission: RE | Admit: 2021-04-04 | Discharge: 2021-04-04 | Disposition: A | Discharge status: Home / Self Care | Payer: Medicare Other | Source: Ambulatory Visit | Attending: Internal Medicine | Admitting: Internal Medicine

## 2021-04-04 ENCOUNTER — Encounter: Payer: Self-pay | Admitting: Internal Medicine

## 2021-04-04 ENCOUNTER — Ambulatory Visit: Payer: Medicare Other | Admitting: Internal Medicine

## 2021-04-04 VITALS — BP 140/80 | HR 74 | Ht 71.0 in | Wt 206.8 lb

## 2021-04-04 DIAGNOSIS — R931 Abnormal findings on diagnostic imaging of heart and coronary circulation: Secondary | ICD-10-CM

## 2021-04-04 DIAGNOSIS — E785 Hyperlipidemia, unspecified: Secondary | ICD-10-CM | POA: Insufficient documentation

## 2021-04-04 DIAGNOSIS — I10 Essential (primary) hypertension: Secondary | ICD-10-CM

## 2021-04-04 LAB — LIPID PANEL
Cholesterol: 162 mg/dL (ref 0–200)
HDL: 64 mg/dL (ref >40)
LDL Cholesterol: 83 mg/dL (ref 0–99)
Total CHOL/HDL Ratio: 2.5 RATIO
Triglycerides: 75 mg/dL (ref <150)
VLDL: 15 mg/dL (ref 0–40)

## 2021-04-04 NOTE — Patient Instructions (Signed)
Medication Instructions:  Your physician recommends that you continue on your current medications as directed. Please refer to the Current Medication list given to you today.  *If you need a refill on your cardiac medications before your next appointment, please call your pharmacy*   Lab Work: Fasting Lipids  If you have labs (blood work) drawn today and your tests are completely normal, you will receive your results only by: MyChart Message (if you have MyChart) OR A paper copy in the mail If you have any lab test that is abnormal or we need to change your treatment, we will call you to review the results.   Testing/Procedures: None today    Follow-Up: At [facility], you and your health needs are our priority.  As part of our continuing mission to provide you with exceptional heart care, we have created designated Provider Care Teams.  These Care Teams include your primary Cardiologist (physician) and Advanced Practice Providers (APPs -  Physician Assistants and Nurse Practitioners) who all work together to provide you with the care you need, when you need it.  We recommend signing up for the patient portal called "MyChart".  Sign up information is provided on this After Visit Summary.  MyChart is used to connect with patients for Virtual Visits (Telemedicine).  Patients are able to view lab/test results, encounter notes, upcoming appointments, etc.  Non-urgent messages can be sent to your provider as well.   To learn more about what you can do with MyChart, go to [email or web address].    Your next appointment:   6 month(s)  The format for your next appointment:   In Person  Provider:   Dr.Hilty    Other Instructions None

## 2021-04-04 NOTE — Progress Notes (Signed)
OFFICE NOTE  Chief Complaint:  Routine follow-up  Primary Care Physician: Craig Townsend, L.Craig Saucer, MD  HPI:  Craig Townsend  is a 65 year old gentleman here for an annual check-up. He has a history of dyslipidemia and mild coronary disease. We have been medically managing him, and he had actually a fairly good NMR profile last year with a particle number of 1080. LDL was 52 on Crestor 5 at bedtime. Unfortunately, he has impaired fasting glucose and with a two-hour glucose tolerance test, the glucose peaked at 196, down to 141. He has not yet required medications for that. Overall the past year, he has had no worsening shortness of breath, chest pain, palpitations, presyncope, or syncopal symptoms. He has been exercising and reported that his fasting glucose is now down to 90. He continues to take Crestor without any difficulty and is due for a lipid profile.  Also of note he was recently diagnosed with a right inguinal hernia which is considered small and he was not advised to undergo surgical repair.  Craig Townsend has no new complaints. He denies any chest pain or worsening shortness of breath. Continues to be quite active.  I saw Craig Townsend back in the office today. He apparently had a recent EKG which there was poor R-wave progression in the septal leads concerning for possible septal infarct. I felt that this likely was recently lead placement. We repeated the EKG in the office today and there is no evidence of prior MI. He's also had no symptoms to suggest that he's had a coronary event. The fact he is doing quite well. I did provide a letter to his employer regarding of the abnormalities on his EKG which I think are artifactual.  04/24/2016  Craig Townsend is seen today in follow-up. Over the past year he's done well. He denies any chest pain or worsening shortness of breath. He is due for repeat lipid profile. Blood pressure was mildly elevated today however came down to 130/78. Continues to be  physically active without any restrictions.  10/21/2017  Craig Townsend returns today for follow-up.  In general he feels like he is doing well.  He denies chest pain or shortness of breath.  He is overdue for a lipid profile.  Blood pressure is noted to be reasonably controlled today 134/82.  He has gained a little bit of weight.  He reports decreased activity.  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  EKG today shows sinus rhythm.  02/03/2019  Craig Townsend is seen today in follow-up.  Overall he continues to do well.  Denies any chest pain or worsening shortness of breath.  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  He denies any palpitations though was noted to have some PVCs on his EKG today.  He is on low-dose Crestor.  He also takes daily aspirin.  He does have a family history of premature coronary disease however has had no notable early onset heart disease.  We discussed possibly re-stratifying him further with coronary artery calcium scoring.  He seemed interested in that today.  06/30/2020  Craig Townsend is seen today in follow-up. Is actually been a year and a half since I last saw him. He underwent calcium scoring in 2020. This demonstrated a calcium score of 224 which was 71st percentile for age and sex matched control. He is noted to more recently been compliant with daily dosing  of rosuvastatin. He is no longer on the 40 mg rather than 5 mg daily. Despite this he has had much better cholesterol. His recent blood work showed total cholesterol of 173, triglycerides 96, HDL 55 and LDL 100. Based on his higher coronary calcium score would advocate even more aggressive therapy to LDL less than 70.  04/04/2021  Craig. Townsend returns today for follow-up.  Overall seems to be doing well.  His lipids recently showed total cholesterol 173, triglycerides 96, HDL 55 and LDL 100.  Blood pressure was slightly elevated today 140/80.  He is taking  rosuvastatin 40 mg daily and the last lipids were representing 20 mg of rosuvastatin.  He is interested in having his lipids rechecked.  PMHx:  Past Medical History:  Diagnosis Date   Acute sinusitis    Asthma    CAD (coronary artery disease)    mild   Dyslipidemia    Family history of heart disease    History of nuclear stress test 05/2007   bruce myoview; normal pattern of perfusion in all regions, post-stress EF 74%, normal low risk study    Hyperlipidemia    Impaired glucose tolerance     Past Surgical History:  Procedure Laterality Date   APPENDECTOMY  10/2004   exploratory lap, resection of Meckel's diverticulum (Dr. Biagio Townsend)    PARATHYROIDECTOMY     suprapubic incision     ? volvulus    TRANSTHORACIC ECHOCARDIOGRAM  2003   EF 60%, trivial TR    FAMHx:  Family History  Problem Relation Age of Onset   Asthma Father        deceased at age 31   Heart disease Father 51   Kidney disease Sister     SOCHx:   reports that he quit smoking about 23 years ago. He has never used smokeless tobacco. He reports current alcohol use. He reports that he does not use drugs.  ALLERGIES:  No Known Allergies  ROS: A comprehensive review of systems was negative.  HOME MEDS: Current Outpatient Medications  Medication Sig Dispense Refill   albuterol (VENTOLIN HFA) 108 (90 Base) MCG/ACT inhaler Inhale 2 puffs into the lungs 4 (four) times daily as needed. rescue 1 Inhaler prn   aspirin 81 MG tablet Take 81 mg by mouth daily.     chlorpheniramine-HYDROcodone (TUSSIONEX PENNKINETIC ER) 10-8 MG/5ML SUER Take 5 mLs by mouth at bedtime as needed for cough. 90 mL 0   nystatin-triamcinolone (MYCOLOG II) cream Apply 1 application topically as needed.     rosuvastatin (CRESTOR) 20 MG tablet Take 1 tablet (20 mg total) by mouth daily. 90 tablet 3   testosterone (ANDROGEL) 50 MG/5GM (1%) GEL      No current facility-administered medications for this visit.    LABS/IMAGING: No results  found for this or any previous visit (from the past 48 hour(s)). No results found.  VITALS: BP 140/80   Pulse 74   Ht 5\' 11"  (1.803 m)   Wt 206 lb 12.8 oz (93.8 kg)   SpO2 97%   BMI 28.84 kg/m   EXAM: General appearance: alert and no distress Neck: no carotid bruit and no JVD Lungs: clear to auscultation bilaterally Heart: regular rate and rhythm, S1, S2 normal, no murmur, click, rub or gallop Abdomen: soft, non-tender; bowel sounds normal; no masses,  no organomegaly Extremities: extremities normal, atraumatic, no cyanosis or edema Pulses: 2+ and symmetric Skin: Skin color, texture, turgor normal. No rashes or lesions Neurologic: Grossly normal Psych: Mood,  affect normal  EKG: Normal sinus rhythm at 74 -personally reviewed  ASSESSMENT: Dyslipidemia Family history of premature coronary disease Elevated CAC score of 224 (71st %) - 02/2019  PLAN: 1.   Craig. Herder has now been taking 40 mg of rosuvastatin daily.  He like to have his lipids rechecked to see if he is at target.  We will go ahead and order that.  He remains asymptomatic and is physically active.  I encouraged him to continue to do this and follow-up with me annually or sooner as necessary.  Chrystie Nose, MD, [facility], FACP  [place]  [facility]  Medical Director of the Advanced Lipid Disorders &  Cardiovascular Risk Reduction Clinic Diplomate of the American Board of Clinical Lipidology Attending Cardiologist  Direct Dial: [phone]  Fax: [phone]  Website:  [email or web address] Nicely Jin [email or web address] 04/04/2021, 9:00 AM

## 2021-04-10 MED ORDER — ROSUVASTATIN CALCIUM 20 MG PO TABS: 20.0000 mg | ORAL_TABLET | Freq: Every day | ORAL | 3 refills | Status: DC

## 2021-04-10 NOTE — Addendum Note (Signed)
Addended by: Marlyn Corporal A on: 04/10/2021 10:58 AM   Modules accepted: Orders

## 2021-07-14 ENCOUNTER — Other Ambulatory Visit: Payer: Self-pay | Admitting: Internal Medicine

## 2021-07-28 ENCOUNTER — Other Ambulatory Visit: Payer: Self-pay

## 2021-07-28 ENCOUNTER — Encounter: Payer: Self-pay | Admitting: Internal Medicine

## 2021-07-28 ENCOUNTER — Ambulatory Visit: Payer: Medicare Other | Admitting: Internal Medicine

## 2021-07-28 VITALS — BP 141/78 | HR 78 | Ht 71.0 in | Wt 214.6 lb

## 2021-07-28 DIAGNOSIS — E785 Hyperlipidemia, unspecified: Secondary | ICD-10-CM

## 2021-07-28 DIAGNOSIS — R931 Abnormal findings on diagnostic imaging of heart and coronary circulation: Secondary | ICD-10-CM

## 2021-07-28 DIAGNOSIS — R030 Elevated blood-pressure reading, without diagnosis of hypertension: Secondary | ICD-10-CM

## 2021-07-28 NOTE — Progress Notes (Signed)
OFFICE NOTE  Chief Complaint:  Elevated blood pressure  Primary Care Physician: [name], L.[name], MD  HPI:  Craig Townsend  is a 66 year old gentleman here for an annual check-up. He has a history of dyslipidemia and mild coronary disease. We have been medically managing him, and he had actually a fairly good NMR profile last year with a particle number of 1080. LDL was 52 on Crestor 5 at bedtime. Unfortunately, he has impaired fasting glucose and with a two-hour glucose tolerance test, the glucose peaked at 196, down to 141. He has not yet required medications for that. Overall the past year, he has had no worsening shortness of breath, chest pain, palpitations, presyncope, or syncopal symptoms. He has been exercising and reported that his fasting glucose is now down to 90. He continues to take Crestor without any difficulty and is due for a lipid profile.  Also of note he was recently diagnosed with a right inguinal hernia which is considered small and he was not advised to undergo surgical repair.  Mr. Duthie has no new complaints. He denies any chest pain or worsening shortness of breath. Continues to be quite active.  I saw Mr. Puls back in the office today. He apparently had a recent EKG which there was poor R-wave progression in the septal leads concerning for possible septal infarct. I felt that this likely was recently lead placement. We repeated the EKG in the office today and there is no evidence of prior MI. He's also had no symptoms to suggest that he's had a coronary event. The fact he is doing quite well. I did provide a letter to his employer regarding of the abnormalities on his EKG which I think are artifactual.  04/24/2016  Mr. Starn is seen today in follow-up. Over the past year he's done well. He denies any chest pain or worsening shortness of breath. He is due for repeat lipid profile. Blood pressure was mildly elevated today however came down to 130/78. Continues to  be physically active without any restrictions.  10/21/2017  Mr. Rubendall returns today for follow-up.  In general he feels like he is doing well.  He denies chest pain or shortness of breath.  He is overdue for a lipid profile.  Blood pressure is noted to be reasonably controlled today 134/82.  He has gained a little bit of weight.  He reports decreased activity.  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  EKG today shows sinus rhythm.  02/03/2019  Mr. Marano is seen today in follow-up.  Overall he continues to do well.  Denies any chest pain or worsening shortness of breath.  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  He denies any palpitations though was noted to have some PVCs on his EKG today.  He is on low-dose Crestor.  He also takes daily aspirin.  He does have a family history of premature coronary disease however has had no notable early onset heart disease.  We discussed possibly re-stratifying him further with coronary artery calcium scoring.  He seemed interested in that today.  06/30/2020  Mr Lastinger is seen today in follow-up. Is actually been a year and a half since I last saw him. He underwent calcium scoring in 2020. This demonstrated a calcium score of 224 which was 71st percentile for age and sex matched control. He is noted to more recently been compliant with daily  dosing of rosuvastatin. He is no longer on the 40 mg rather than 5 mg daily. Despite this he has had much better cholesterol. His recent blood work showed total cholesterol of 173, triglycerides 96, HDL 55 and LDL 100. Based on his higher coronary calcium score would advocate even more aggressive therapy to LDL less than 70.  04/04/2021  Mr. Cooler returns today for follow-up.  Overall seems to be doing well.  His lipids recently showed total cholesterol 173, triglycerides 96, HDL 55 and LDL 100.  Blood pressure was slightly elevated today 140/80.  He is  taking rosuvastatin 40 mg daily and the last lipids were representing 20 mg of rosuvastatin.  He is interested in having his lipids rechecked.  07/28/2021  Mr. Rumore is seen today in follow-up.  He recently had upper respiratory infection was seen in urgent care.  Blood pressure was noted to be high about 150/100.  He was recommended to see his cardiologist and is here today for follow-up.  Blood pressure in the office today much better 141/78.  He has had a little bit of weight gain and less physical activity.  PMHx:  Past Medical History:  Diagnosis Date   Acute sinusitis    Asthma    CAD (coronary artery disease)    mild   Dyslipidemia    Family history of heart disease    History of nuclear stress test 05/2007   bruce myoview; normal pattern of perfusion in all regions, post-stress EF 74%, normal low risk study    Hyperlipidemia    Impaired glucose tolerance     Past Surgical History:  Procedure Laterality Date   APPENDECTOMY  10/2004   exploratory lap, resection of Meckel's diverticulum (Dr. Biagio Borg)    PARATHYROIDECTOMY     suprapubic incision     ? volvulus    TRANSTHORACIC ECHOCARDIOGRAM  2003   EF 60%, trivial TR    FAMHx:  Family History  Problem Relation Age of Onset   Asthma Father        deceased at age 54   Heart disease Father 80   Kidney disease Sister     SOCHx:   reports that he quit smoking about 23 years ago. His smoking use included cigarettes. He has never used smokeless tobacco. He reports current alcohol use. He reports that he does not use drugs.  ALLERGIES:  No Known Allergies  ROS: A comprehensive review of systems was negative.  HOME MEDS: Current Outpatient Medications  Medication Sig Dispense Refill   albuterol (VENTOLIN HFA) 108 (90 Base) MCG/ACT inhaler Inhale 2 puffs into the lungs 4 (four) times daily as needed. rescue 1 Inhaler prn   aspirin 81 MG tablet Take 81 mg by mouth daily.     chlorpheniramine-HYDROcodone (TUSSIONEX  PENNKINETIC ER) 10-8 MG/5ML SUER Take 5 mLs by mouth at bedtime as needed for cough. 90 mL 0   nystatin-triamcinolone (MYCOLOG II) cream Apply 1 application topically as needed.     rosuvastatin (CRESTOR) 20 MG tablet TAKE 1 TABLET BY MOUTH  DAILY 90 tablet 3   testosterone cypionate (DEPOTESTOSTERONE CYPIONATE) 200 MG/ML injection SMARTSIG:Milliliter(s) IM     testosterone (ANDROGEL) 50 MG/5GM (1%) GEL  (Patient not taking: Reported on 07/28/2021)     No current facility-administered medications for this visit.    LABS/IMAGING: No results found for this or any previous visit (from the past 48 hour(s)). No results found.  VITALS: BP (!) 141/78    Pulse 78  Ht 5\' 11"  (1.803 m)    Wt 214 lb 9.6 oz (97.3 kg)    SpO2 98%    BMI 29.93 kg/m   EXAM: General appearance: alert and no distress Neck: no carotid bruit and no JVD Lungs: clear to auscultation bilaterally Heart: regular rate and rhythm, S1, S2 normal, no murmur, click, rub or gallop Abdomen: soft, non-tender; bowel sounds normal; no masses,  no organomegaly Extremities: extremities normal, atraumatic, no cyanosis or edema Pulses: 2+ and symmetric Skin: Skin color, texture, turgor normal. No rashes or lesions Neurologic: Grossly normal Psych: Mood, affect normal  EKG: Normal sinus rhythm at 78 - personally reviewed  ASSESSMENT: Elevated blood pressure without the diagnosis of hypertension Dyslipidemia Family history of premature coronary disease Elevated CAC score of 224 (71st %) - 02/2019  PLAN: 1.   Mr. Gravley recently has had elevated blood pressure but not clearly a diagnosis of hypertension.  This was in the setting of acute respiratory illness.  His blood pressure is a little elevated today.  I like for him to take some home blood pressure readings and work on more physical activity and weight loss.  If the blood pressure does not respond he will likely need to go on a little therapy.  Plan follow-up with me in 6 months  or sooner as necessary but will make adjustments remotely prior to that.  Stevan Born, MD, [facility], FACP  [place]   [facility]  Medical Director of the Advanced Lipid Disorders &  Cardiovascular Risk Reduction Clinic Diplomate of the American Board of Clinical Lipidology Attending Cardiologist  Direct Dial: [phone]   Fax: [phone]  Website:  [email or web address] 07/28/2021, 8:27 AM

## 2021-07-28 NOTE — Patient Instructions (Signed)
Medication Instructions:  Your physician recommends that you continue on your current medications as directed. Please refer to the Current Medication list given to you today.  *If you need a refill on your cardiac medications before your next appointment, please call your pharmacy*   Follow-Up: At [facility], you and your health needs are our priority.  As part of our continuing mission to provide you with exceptional heart care, we have created designated Provider Care Teams.  These Care Teams include your primary Cardiologist (physician) and Advanced Practice Providers (APPs -  Physician Assistants and Nurse Practitioners) who all work together to provide you with the care you need, when you need it.  We recommend signing up for the patient portal called "MyChart".  Sign up information is provided on this After Visit Summary.  MyChart is used to connect with patients for Virtual Visits (Telemedicine).  Patients are able to view lab/test results, encounter notes, upcoming appointments, etc.  Non-urgent messages can be sent to your provider as well.   To learn more about what you can do with MyChart, go to [email or web address].    Your next appointment:    6 months with Dr. Rennis Golden   Other Instructions  Please check your BP at home about TWO weeks -- send readings via MyChart or call with them  HOW TO TAKE YOUR BLOOD PRESSURE: Rest 5 minutes before taking your blood pressure. Dont smoke or drink caffeinated beverages for at least 30 minutes before. Take your blood pressure before (not after) you eat. Sit comfortably with your back supported and both feet on the floor (dont cross your legs). Elevate your arm to heart level on a table or a desk. Use the proper sized cuff. It should fit smoothly and snugly around your bare upper arm. There should be enough room to slip a fingertip under the cuff. The bottom edge of the cuff should be 1 inch above the crease of the elbow. Ideally, take  3 measurements at one sitting and record the average.

## 2022-06-14 ENCOUNTER — Other Ambulatory Visit: Payer: Self-pay | Admitting: Internal Medicine

## 2022-06-26 NOTE — Progress Notes (Unsigned)
Cardiology Office Note:    Date:  06/27/2022   ID:  LADEN FIELDHOUSE, DOB 07/17/1955, MRN [id number]  PCP:  Asencion Gowda.[name], MD  [facility] Cardiologist: Chrystie Nose, MD   Reason for visit: 6 month f/u  History of Present Illness:    Craig Townsend is a 67 y.o. male with a hx of anemia, mild CAD.  Patient states father had CAD/angina and died age 51; otherwise no significant family history of CAD.  He states some relatives have lived into their 67s.  He last saw Dr. Rennis Golden in February 2023 to follow-up blood pressure 150/100 in the setting of URI.  He was asked to keep a blood pressure log, increase physical activity and work on weight loss and follow-up in 6 months.  Today, patient states he is doing well.  He has worked for the police department previously.  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  He mentions that stress from work sometimes from his relationship with his girlfriend.  Overall he eats well and tries to exercise regularly.  By chart review, he has lost 10 pounds since last year.  He thinks he is likely lost more than that without his shoes on.  He denies chest pain, shortness of breath, PND, orthopnea, lower extremity edema, palpitations, lightheadedness and syncope.  He states regularly use of Crestor and periodic use of aspirin.  Denies bleeding issues.  He has not checked his blood pressure regularly at home but has a Omron automatic cuff.    Past Medical History:  Diagnosis Date   Acute sinusitis    Asthma    CAD (coronary artery disease)    mild   Dyslipidemia    Family history of heart disease    History of nuclear stress test 05/2007   bruce myoview; normal pattern of perfusion in all regions, post-stress EF 74%, normal low risk study    Hyperlipidemia    Impaired glucose tolerance     Past Surgical History:  Procedure Laterality Date   APPENDECTOMY  10/2004   exploratory lap, resection of Meckel's diverticulum (Dr. Biagio Borg)     PARATHYROIDECTOMY     suprapubic incision     ? volvulus    TRANSTHORACIC ECHOCARDIOGRAM  2003   EF 60%, trivial TR    Current Medications: Current Meds  Medication Sig   aspirin 81 MG tablet Take 81 mg by mouth daily.   prednisoLONE acetate (PRED FORTE) 1 % ophthalmic suspension SMARTSIG:In Eye(s)   sildenafil (REVATIO) 20 MG tablet 3-5 pills one hour before planned intercourse   [DISCONTINUED] rosuvastatin (CRESTOR) 20 MG tablet Take 1 tablet (20 mg total) by mouth daily. Please keep scheduled appointment for further refills     Allergies:   Patient has no known allergies.   Social History   Socioeconomic History   Marital status: Divorced    Spouse name: Not on file   Number of children: Not on file   Years of education: grad degr.   Highest education level: Not on file  Occupational History   Occupation: Retired    Comment: Emergency planning/management officer   Occupation: Plainclothes baliff    Comment: Retail buyer: UNEMPLOYED  Tobacco Use   Smoking status: Former    Types: Cigarettes    Quit date: 02/26/1998    Years since quitting: 24.3   Smokeless tobacco: Never  Vaping Use   Vaping Use: Never used  Substance and Sexual Activity   Alcohol use: Yes  Comment: occasionally   Drug use: No   Sexual activity: Not on file  Other Topics Concern   Not on file  Social History Narrative   Not on file   Social Determinants of Health   Financial Resource Strain: Not on file  Food Insecurity: Not on file  Transportation Needs: Not on file  Physical Activity: Not on file  Stress: Not on file  Social Connections: Not on file     Family History: The patient's family history includes Asthma in his father; Heart disease (age of onset: 35) in his father; Kidney disease in his sister.  ROS:   Please see the history of present illness.     EKGs/Labs/Other Studies Reviewed:    EKG:  The ekg ordered today demonstrates normal sinus rhythm, heart rate 71, rightward axis  similar to EKG November 2022.  Recent Labs: No results found for requested labs within last 365 days.   Recent Lipid Panel Lab Results  Component Value Date/Time   CHOL 162 04/04/2021 09:25 AM   CHOL 173 06/23/2020 09:31 AM   CHOL 159 08/05/2013 08:13 AM   TRIG 75 04/04/2021 09:25 AM   TRIG 56 10/21/2014 07:43 AM   TRIG 58 08/05/2013 08:13 AM   HDL 64 04/04/2021 09:25 AM   HDL 55 06/23/2020 09:31 AM   HDL 56 10/21/2014 07:43 AM   HDL 64 08/05/2013 08:13 AM   LDLCALC 83 04/04/2021 09:25 AM   LDLCALC 100 (H) 06/23/2020 09:31 AM   LDLCALC 83 08/05/2013 08:13 AM    Physical Exam:    VS:  BP (!) 148/88   Pulse 71   Ht 5\' 11"  (1.803 m)   Wt 204 lb 9.6 oz (92.8 kg)   SpO2 96%   BMI 28.54 kg/m    No data found.  HYPERTENSION CONTROL Vitals:   06/27/22 0911 06/27/22 1003  BP: (!) 150/100 (!) 148/88    The patient's blood pressure is elevated above target today.  In order to address the patient's elevated BP: Blood pressure will be monitored at home to determine if medication changes need to be made.; Labs and/or other diagnostics are currently pending prior to making blood pressure medication adjustments.       Wt Readings from Last 3 Encounters:  06/27/22 204 lb 9.6 oz (92.8 kg)  07/28/21 214 lb 9.6 oz (97.3 kg)  04/04/21 206 lb 12.8 oz (93.8 kg)     GEN:  Well nourished, well developed in no acute distress HEENT: Normal NECK: No JVD; No carotid bruits CARDIAC: RRR, no murmurs, rubs, gallops RESPIRATORY:  Clear to auscultation without rales, wheezing or rhonchi  ABDOMEN: Soft, non-tender, non-distended MUSCULOSKELETAL: No edema SKIN: Warm and dry NEUROLOGIC:  Alert and oriented PSYCHIATRIC:  Normal affect     ASSESSMENT AND PLAN   Elevated CAC score 224 (71st %) - 02/2019 Family hx of CAD  -Continue Crestor 20 mg daily -refilled today. -Check fasting lipids today. -Current goal is to focus on blood pressure control. -Gave patient education sheet on  healthy lifestyle and cardiac risk factor modification.  Hypertension -Undefined if it is white coat hypertension or true hypertension. -Patient would like to keep a regular blood pressure log for 2 months for review.  If blood pressure consistently over 140/90 during the next 2 weeks, asked him to send [place] readings via MyChart. -We will check kidney function/electrolytes today in case BP medication is warranted. -Follow-up in 6 to 8 weeks -bring blood pressure log and blood pressure cuff.  Given information about how to take accurate blood pressure reading. -Goal BP is <130/80.  Recommend DASH diet (high in vegetables, fruits, low-fat dairy products, whole grains, poultry, fish, and nuts and low in sweets, sugar-sweetened beverages, and red meats), salt restriction and increase physical activity.  Hyperlipidemia with goal LDL less than 100 -LDL 83 in November 2022. -Continue Crestor. -Check fasting lipids today. -Discussed cholesterol lowering diets - Mediterranean diet, DASH diet, vegetarian diet, low-carbohydrate diet and avoidance of trans fats.  Discussed healthier choice substitutes.  Nuts, high-fiber foods, and fiber supplements may also improve lipids.    Disposition - Follow-up in 6 to 8 weeks.   Medication Adjustments/Labs and Tests Ordered: Current medicines are reviewed at length with the patient today.  Concerns regarding medicines are outlined above.  Orders Placed This Encounter  Procedures   Comprehensive metabolic panel   Lipid panel   EKG 12-Lead   Meds ordered this encounter  Medications   rosuvastatin (CRESTOR) 20 MG tablet    Sig: Take 1 tablet (20 mg total) by mouth daily. Please keep scheduled appointment for further refills    Dispense:  90 tablet    Refill:  3    Please send a replace/new response with 90-Day Supply if appropriate to maximize member benefit. Requesting 1 year supply.    Patient Instructions  Medication Instructions:  No Changes *If you  need a refill on your cardiac medications before your next appointment, please call your pharmacy*   Lab Work: CMET, Lipid Panel If you have labs (blood work) drawn today and your tests are completely normal, you will receive your results only by: [place] (if you have MyChart) OR A paper copy in the mail If you have any lab test that is abnormal or we need to change your treatment, we will call you to review the results.   Testing/Procedures: No Testing   Follow-Up: At [facility], you and your health needs are our priority.  As part of our continuing mission to provide you with exceptional heart care, we have created designated Provider Care Teams.  These Care Teams include your primary Cardiologist (physician) and Advanced Practice Providers (APPs -  Physician Assistants and Nurse Practitioners) who all work together to provide you with the care you need, when you need it.  We recommend signing up for the patient portal called "MyChart".  Sign up information is provided on this After Visit Summary.  MyChart is used to connect with patients for Virtual Visits (Telemedicine).  Patients are able to view lab/test results, encounter notes, upcoming appointments, etc.  Non-urgent messages can be sent to your provider as well.   To learn more about what you can do with MyChart, go to [email or web address].    Your next appointment:   6-8 week(s)  Provider:   Caron Presume, PA-C       Signed, Warren Lacy, PA-C  06/27/2022 10:12 AM    [place]

## 2022-06-27 ENCOUNTER — Encounter: Payer: Self-pay | Admitting: Physician Assistant

## 2022-06-27 ENCOUNTER — Ambulatory Visit: Payer: Medicare Other | Attending: Physician Assistant | Admitting: Physician Assistant

## 2022-06-27 VITALS — BP 148/88 | HR 71 | Ht 71.0 in | Wt 204.6 lb

## 2022-06-27 DIAGNOSIS — Z8249 Family history of ischemic heart disease and other diseases of the circulatory system: Secondary | ICD-10-CM

## 2022-06-27 DIAGNOSIS — E785 Hyperlipidemia, unspecified: Secondary | ICD-10-CM

## 2022-06-27 DIAGNOSIS — R931 Abnormal findings on diagnostic imaging of heart and coronary circulation: Secondary | ICD-10-CM

## 2022-06-27 DIAGNOSIS — I10 Essential (primary) hypertension: Secondary | ICD-10-CM

## 2022-06-27 MED ORDER — ROSUVASTATIN CALCIUM 20 MG PO TABS: 20.0000 mg | ORAL_TABLET | Freq: Every day | ORAL | 3 refills | Status: DC

## 2022-06-27 NOTE — Patient Instructions (Signed)
Medication Instructions:  No Changes *If you need a refill on your cardiac medications before your next appointment, please call your pharmacy*   Lab Work: CMET, Lipid Panel If you have labs (blood work) drawn today and your tests are completely normal, you will receive your results only by: [place] (if you have MyChart) OR A paper copy in the mail If you have any lab test that is abnormal or we need to change your treatment, we will call you to review the results.   Testing/Procedures: No Testing   Follow-Up: At [facility], you and your health needs are our priority.  As part of our continuing mission to provide you with exceptional heart care, we have created designated Provider Care Teams.  These Care Teams include your primary Cardiologist (physician) and Advanced Practice Providers (APPs -  Physician Assistants and Nurse Practitioners) who all work together to provide you with the care you need, when you need it.  We recommend signing up for the patient portal called "MyChart".  Sign up information is provided on this After Visit Summary.  MyChart is used to connect with patients for Virtual Visits (Telemedicine).  Patients are able to view lab/test results, encounter notes, upcoming appointments, etc.  Non-urgent messages can be sent to your provider as well.   To learn more about what you can do with MyChart, go to [email or web address].    Your next appointment:   6-8 week(s)  Provider:   Caron Presume, PA-C

## 2022-06-28 LAB — LIPID PANEL
Chol/HDL Ratio: 2.6 ratio (ref 0.0–5.0)
Cholesterol, Total: 147 mg/dL (ref 100–199)
HDL: 57 mg/dL (ref >39)
LDL Chol Calc (NIH): 76 mg/dL (ref 0–99)
Triglycerides: 74 mg/dL (ref 0–149)
VLDL Cholesterol Cal: 14 mg/dL (ref 5–40)

## 2022-06-28 LAB — COMPREHENSIVE METABOLIC PANEL
ALT: 101 IU/L — ABNORMAL HIGH (ref 0–44)
AST: 58 IU/L — ABNORMAL HIGH (ref 0–40)
Albumin/Globulin Ratio: 1.6 (ref 1.2–2.2)
Albumin: 4.4 g/dL (ref 3.9–4.9)
Alkaline Phosphatase: 105 IU/L (ref 44–121)
BUN/Creatinine Ratio: 11 (ref 10–24)
BUN: 11 mg/dL (ref 8–27)
Bilirubin Total: 0.9 mg/dL (ref 0.0–1.2)
CO2: 25 mmol/L (ref 20–29)
Calcium: 9.1 mg/dL (ref 8.6–10.2)
Chloride: 99 mmol/L (ref 96–106)
Creatinine, Ser: 1.01 mg/dL (ref 0.76–1.27)
Globulin, Total: 2.7 g/dL (ref 1.5–4.5)
Glucose: 104 mg/dL — ABNORMAL HIGH (ref 70–99)
Potassium: 4.8 mmol/L (ref 3.5–5.2)
Sodium: 138 mmol/L (ref 134–144)
Total Protein: 7.1 g/dL (ref 6.0–8.5)
eGFR: 82 mL/min/{1.73_m2} (ref >59)

## 2022-06-29 ENCOUNTER — Telehealth: Payer: Self-pay

## 2022-06-29 NOTE — Telephone Encounter (Addendum)
Called patient regarding results. Unable to leave message for patient to call office voicemail full.----- Message from Warren Lacy, PA-C sent at 06/28/2022  3:17 PM EST ----- Kidney function and electrolytes normal.   LDL (bad cholesterol) improved and at goal of <100 Liver function slightly elevated - do not see prior to compare.   Plan: continue current medications

## 2022-07-11 ENCOUNTER — Telehealth: Payer: Self-pay

## 2022-07-11 NOTE — Telephone Encounter (Addendum)
Called patient regarding results . Unable to leave message due to voicemail full. Letter mailed 07/11/22----- Message from Warren Lacy, PA-C sent at 06/28/2022  3:17 PM EST ----- Kidney function and electrolytes normal.   LDL (bad cholesterol) improved and at goal of <100 Liver function slightly elevated - do not see prior to compare.   Plan: continue current medications

## 2022-08-07 NOTE — Progress Notes (Unsigned)
Cardiology Office Note:    Date:  08/08/2022   ID:  Craig Townsend, DOB March 13, 1956, MRN [id number]  PCP:  Aurea Graff.Marlou Sa, [place] Cardiologist: Pixie Casino, MD   Reason for visit: 6-8 week follow-up  History of Present Illness:    Craig Townsend is a 67 y.o. male with a hx of anemia, mild CAD.  Father had CAD/angina and died age 7; otherwise no significant family history of CAD.  He states some relatives have lived into their 53s.   I saw him in January 2024.  Blood pressure was 150/100, repeat 148/88.  Here to review home BP log and follow-up on lipid control - labs with LDL 76.  Liver function mildly elevated.    Patient states he has been exercising more regular since her last visit.  He goes to the gym in [place] with his insurance discount.  He has done the treadmill twice a week for the last 3 weeks getting his heart rate to 120.  His blood pressure is improved today from last visit.  Otherwise he denies chest pain, shortness of breath and lightheadedness.  He does not take aspirin daily currently.    Past Medical History:  Diagnosis Date   Acute sinusitis    Asthma    CAD (coronary artery disease)    mild   Dyslipidemia    Family history of heart disease    History of nuclear stress test 05/2007   bruce myoview; normal pattern of perfusion in all regions, post-stress EF 74%, normal low risk study    Hyperlipidemia    Impaired glucose tolerance     Past Surgical History:  Procedure Laterality Date   APPENDECTOMY  10/2004   exploratory lap, resection of Meckel's diverticulum (Dr. Saul Fordyce)    PARATHYROIDECTOMY     suprapubic incision     ? volvulus    TRANSTHORACIC ECHOCARDIOGRAM  2003   EF 60%, trivial TR    Current Medications: Current Meds  Medication Sig   aspirin 81 MG tablet Take 81 mg by mouth daily.   cetirizine (ZYRTEC) 10 MG tablet Take 10 mg by mouth daily.   rosuvastatin (CRESTOR) 20 MG tablet Take 1 tablet (20 mg total) by  mouth daily. Please keep scheduled appointment for further refills   sildenafil (REVATIO) 20 MG tablet 3-5 pills one hour before planned intercourse   testosterone cypionate (DEPOTESTOSTERONE CYPIONATE) 200 MG/ML injection Inject 200 mg into the muscle every 14 (fourteen) days.     Allergies:   Patient has no known allergies.   Social History   Socioeconomic History   Marital status: Divorced    Spouse name: Not on file   Number of children: Not on file   Years of education: grad degr.   Highest education level: Not on file  Occupational History   Occupation: Retired    Comment: Engineer, structural   Occupation: Plainclothes baliff    Comment: TEFL teacher: UNEMPLOYED  Tobacco Use   Smoking status: Former    Types: Cigarettes    Quit date: 02/26/1998    Years since quitting: 24.4   Smokeless tobacco: Never  Vaping Use   Vaping Use: Never used  Substance and Sexual Activity   Alcohol use: Yes    Comment: occasionally   Drug use: No   Sexual activity: Not on file  Other Topics Concern   Not on file  Social History Narrative   Not on file   Social Determinants  of Health   Financial Resource Strain: Not on file  Food Insecurity: Not on file  Transportation Needs: Not on file  Physical Activity: Not on file  Stress: Not on file  Social Connections: Not on file     Family History: The patient's family history includes Asthma in his father; Heart disease (age of onset: 67) in his father; Kidney disease in his sister.  ROS:   Please see the history of present illness.     EKGs/Labs/Other Studies Reviewed:    Recent Labs: 06/27/2022: ALT 101; BUN 11; Creatinine, Ser 1.01; Potassium 4.8; Sodium 138   Recent Lipid Panel Lab Results  Component Value Date/Time   CHOL 147 06/27/2022 10:24 AM   CHOL 159 08/05/2013 08:13 AM   TRIG 74 06/27/2022 10:24 AM   TRIG 56 10/21/2014 07:43 AM   TRIG 58 08/05/2013 08:13 AM   HDL 57 06/27/2022 10:24 AM   HDL 56 10/21/2014  07:43 AM   HDL 64 08/05/2013 08:13 AM   LDLCALC 76 06/27/2022 10:24 AM   LDLCALC 83 08/05/2013 08:13 AM    Physical Exam:    VS:  BP 132/72 (BP Location: Left Arm, Patient Position: Sitting, Cuff Size: Large)   Pulse 85   Ht '5\' 11"'$  (1.803 m)   Wt 208 lb 12.8 oz (94.7 kg)   SpO2 94%   BMI 29.12 kg/m    No data found.       Wt Readings from Last 3 Encounters:  08/08/22 208 lb 12.8 oz (94.7 kg)  06/27/22 204 lb 9.6 oz (92.8 kg)  07/28/21 214 lb 9.6 oz (97.3 kg)     GEN:  Well nourished, well developed in no acute distress HEENT: Normal NECK: No JVD; No carotid bruits CARDIAC: RRR, no murmurs, rubs, gallops RESPIRATORY:  Clear to auscultation without rales, wheezing or rhonchi  ABDOMEN: Soft, non-tender, non-distended MUSCULOSKELETAL: No edema SKIN: Warm and dry NEUROLOGIC:  Alert and oriented PSYCHIATRIC:  Normal affect     ASSESSMENT AND PLAN   Elevated CAC score 224 (71st %) - 02/2019 Family hx of CAD  -LDL 76 in January 2024. -Continue Crestor 20 mg daily. -With age less than 70 and calcium score over 100, we decided to pursue regular use of aspirin 81 mg daily. -Continue regular exercise.   Hypertension, improved -BP may have improved with increased aerobic exercise. -Asked patient to keep blood pressure log checking blood pressure twice weekly and bring in cuff to appointment in 6 months. -Goal BP is <130/80.  Recommend DASH diet (high in vegetables, fruits, low-fat dairy products, whole grains, poultry, fish, and nuts and low in sweets, sugar-sweetened beverages, and red meats), salt restriction and increase physical activity.   Hyperlipidemia with goal LDL less than 100 -LDL 76 in 06/2022 -Continue Crestor. -Mildly elevated LFTs --May be due to fatty liver.  Recommend rechecking LFTs in 6 months. -Discussed cholesterol lowering diets - Mediterranean diet, DASH diet, vegetarian diet, low-carbohydrate diet and avoidance of trans fats.  Discussed healthier choice  substitutes.  Nuts, high-fiber foods, and fiber supplements may also improve lipids.    Anemia -With new recommendation of aspirin 81 mg daily and history of anemia, will check baseline CBC (none in system since 2012, admits to not seeing a PCP regularly).   Disposition - Follow-up in 6 months to recheck liver function and blood pressure control.  Medication Adjustments/Labs and Tests Ordered: Current medicines are reviewed at length with the patient today.  Concerns regarding medicines are outlined above.  Orders Placed This Encounter  Procedures   CBC   Hepatic function panel   No orders of the defined types were placed in this encounter.   Patient Instructions  Medication Instructions:  No Changes *If you need a refill on your cardiac medications before your next appointment, please call your pharmacy*   Lab Work: CBC today Hepatic Panel. ( 1 week prior to follow-up visit). If you have labs (blood work) drawn today and your tests are completely normal, you will receive your results only by: [facility] (if you have MyChart) OR A paper copy in the mail If you have any lab test that is abnormal or we need to change your treatment, we will call you to review the results.   Testing/Procedures: No Testing   Follow-Up: At [facility], you and your health needs are our priority.  As part of our continuing mission to provide you with exceptional heart care, we have created designated Provider Care Teams.  These Care Teams include your primary Cardiologist (physician) and Advanced Practice Providers (APPs -  Physician Assistants and Nurse Practitioners) who all work together to provide you with the care you need, when you need it.  We recommend signing up for the patient portal called "MyChart".  Sign up information is provided on this After Visit Summary.  MyChart is used to connect with patients for Virtual Visits (Telemedicine).  Patients are able to view lab/test  results, encounter notes, upcoming appointments, etc.  Non-urgent messages can be sent to your provider as well.   To learn more about what you can do with MyChart, go to [email or web address].    Your next appointment:   6 month(s)  Provider:   Pixie Casino, MD     Other Instructions Monitor Blood Pressure 3 times weekly. Please Bring Home Blood Pressure Cuff to Follow up visit.   Signed, Warren Lacy, PA-C  08/08/2022 9:52 AM    [facility]

## 2022-08-08 ENCOUNTER — Encounter: Payer: Self-pay | Admitting: Physician Assistant

## 2022-08-08 ENCOUNTER — Ambulatory Visit: Payer: Medicare Other | Attending: Physician Assistant | Admitting: Physician Assistant

## 2022-08-08 VITALS — BP 132/72 | HR 85 | Ht 71.0 in | Wt 208.8 lb

## 2022-08-08 DIAGNOSIS — E785 Hyperlipidemia, unspecified: Secondary | ICD-10-CM

## 2022-08-08 DIAGNOSIS — R931 Abnormal findings on diagnostic imaging of heart and coronary circulation: Secondary | ICD-10-CM

## 2022-08-08 DIAGNOSIS — I10 Essential (primary) hypertension: Secondary | ICD-10-CM

## 2022-08-08 NOTE — Patient Instructions (Addendum)
Medication Instructions:  No Changes *If you need a refill on your cardiac medications before your next appointment, please call your pharmacy*   Lab Work: CBC today Hepatic Panel. ( 1 week prior to follow-up visit). If you have labs (blood work) drawn today and your tests are completely normal, you will receive your results only by: [place] (if you have MyChart) OR A paper copy in the mail If you have any lab test that is abnormal or we need to change your treatment, we will call you to review the results.   Testing/Procedures: No Testing   Follow-Up: At [facility], you and your health needs are our priority.  As part of our continuing mission to provide you with exceptional heart care, we have created designated Provider Care Teams.  These Care Teams include your primary Cardiologist (physician) and Advanced Practice Providers (APPs -  Physician Assistants and Nurse Practitioners) who all work together to provide you with the care you need, when you need it.  We recommend signing up for the patient portal called "MyChart".  Sign up information is provided on this After Visit Summary.  MyChart is used to connect with patients for Virtual Visits (Telemedicine).  Patients are able to view lab/test results, encounter notes, upcoming appointments, etc.  Non-urgent messages can be sent to your provider as well.   To learn more about what you can do with MyChart, go to [email or web address].    Your next appointment:   6 month(s)  Provider:   Pixie Casino, MD     Other Instructions Monitor Blood Pressure 3 times weekly. Please Bring Home Blood Pressure Cuff to Follow up visit.

## 2022-08-09 LAB — CBC
Hematocrit: 48.3 % (ref 37.5–51.0)
Hemoglobin: 16.8 g/dL (ref 13.0–17.7)
MCH: 33 pg (ref 26.6–33.0)
MCHC: 34.8 g/dL (ref 31.5–35.7)
MCV: 95 fL (ref 79–97)
Platelets: 273 10*3/uL (ref 150–450)
RBC: 5.09 x10E6/uL (ref 4.14–5.80)
RDW: 14.3 % (ref 11.6–15.4)
WBC: 7.8 10*3/uL (ref 3.4–10.8)

## 2022-08-15 ENCOUNTER — Telehealth: Payer: Self-pay

## 2022-08-15 NOTE — Telephone Encounter (Addendum)
Called patient regarding results . Unable to leave message due voicemail full----- Message from Warren Lacy, PA-C sent at 08/13/2022 11:57 AM EDT ----- Blood counts normal.

## 2022-08-30 ENCOUNTER — Telehealth: Payer: Self-pay

## 2022-08-30 NOTE — Telephone Encounter (Addendum)
Results viewed by patient via My chart----- Message from Warren Lacy, PA-C sent at 08/13/2022 11:57 AM EDT ----- Blood counts normal.

## 2023-02-08 ENCOUNTER — Ambulatory Visit: Payer: Medicare Other | Attending: Internal Medicine | Admitting: Internal Medicine

## 2023-02-08 ENCOUNTER — Encounter: Payer: Self-pay | Admitting: Internal Medicine

## 2023-02-08 VITALS — BP 124/84 | HR 74 | Ht 71.0 in | Wt 206.8 lb

## 2023-02-08 DIAGNOSIS — Z8249 Family history of ischemic heart disease and other diseases of the circulatory system: Secondary | ICD-10-CM | POA: Diagnosis not present

## 2023-02-08 DIAGNOSIS — E785 Hyperlipidemia, unspecified: Secondary | ICD-10-CM

## 2023-02-08 DIAGNOSIS — R931 Abnormal findings on diagnostic imaging of heart and coronary circulation: Secondary | ICD-10-CM | POA: Diagnosis not present

## 2023-02-08 DIAGNOSIS — R748 Abnormal levels of other serum enzymes: Secondary | ICD-10-CM

## 2023-02-08 NOTE — Patient Instructions (Signed)
Medication Instructions:  STOP rosuvastation   *If you need a refill on your cardiac medications before your next appointment, please call your pharmacy*   Lab Work: Non-Fasting liver enzyme testing in 2-4 weeks 9/20 -- 10/4  If you have labs (blood work) drawn today and your tests are completely normal, you will receive your results only by: MyChart Message (if you have MyChart) OR A paper copy in the mail If you have any lab test that is abnormal or we need to change your treatment, we will call you to review the results.   Testing/Procedures: Liver Ultrasound @ [facility]   Follow-Up: At [facility], you and your health needs are our priority.  As part of our continuing mission to provide you with exceptional heart care, we have created designated Provider Care Teams.  These Care Teams include your primary Cardiologist (physician) and Advanced Practice Providers (APPs -  Physician Assistants and Nurse Practitioners) who all work together to provide you with the care you need, when you need it.  We recommend signing up for the patient portal called "MyChart".  Sign up information is provided on this After Visit Summary.  MyChart is used to connect with patients for Virtual Visits (Telemedicine).  Patients are able to view lab/test results, encounter notes, upcoming appointments, etc.  Non-urgent messages can be sent to your provider as well.   To learn more about what you can do with MyChart, go to [email or web address].    Your next appointment:    3 months with Dr. Rennis Golden or Juanda Crumble PA

## 2023-02-08 NOTE — Progress Notes (Signed)
OFFICE NOTE  Chief Complaint:  No complaints  Primary Care Physician: [name], L.[name], MD  HPI:  Craig Townsend  is a 67 year old gentleman here for an annual check-up. He has a history of dyslipidemia and mild coronary disease. We have been medically managing him, and he had actually a fairly good NMR profile last year with a particle number of 1080. LDL was 52 on Crestor 5 at bedtime. Unfortunately, he has impaired fasting glucose and with a two-hour glucose tolerance test, the glucose peaked at 196, down to 141. He has not yet required medications for that. Overall the past year, he has had no worsening shortness of breath, chest pain, palpitations, presyncope, or syncopal symptoms. He has been exercising and reported that his fasting glucose is now down to 90. He continues to take Crestor without any difficulty and is due for a lipid profile.  Also of note he was recently diagnosed with a right inguinal hernia which is considered small and he was not advised to undergo surgical repair.  Craig Townsend has no new complaints. He denies any chest pain or worsening shortness of breath. Continues to be quite active.  I saw Craig Townsend back in the office today. He apparently had a recent EKG which there was poor R-wave progression in the septal leads concerning for possible septal infarct. I felt that this likely was recently lead placement. We repeated the EKG in the office today and there is no evidence of prior MI. He's also had no symptoms to suggest that he's had a coronary event. The fact he is doing quite well. I did provide a letter to his employer regarding of the abnormalities on his EKG which I think are artifactual.  04/24/2016  Craig Townsend is seen today in follow-up. Over the past year he's done well. He denies any chest pain or worsening shortness of breath. He is due for repeat lipid profile. Blood pressure was mildly elevated today however came down to 130/78. Continues to be  physically active without any restrictions.  10/21/2017  Craig Townsend returns today for follow-up.  In general he feels like he is doing well.  He denies chest pain or shortness of breath.  He is overdue for a lipid profile.  Blood pressure is noted to be reasonably controlled today 134/82.  He has gained a little bit of weight.  He reports decreased activity.  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  EKG today shows sinus rhythm.  02/03/2019  Craig Townsend is seen today in follow-up.  Overall he continues to do well.  Denies any chest pain or worsening shortness of breath.  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  He denies any palpitations though was noted to have some PVCs on his EKG today.  He is on low-dose Crestor.  He also takes daily aspirin.  He does have a family history of premature coronary disease however has had no notable early onset heart disease.  We discussed possibly re-stratifying him further with coronary artery calcium scoring.  He seemed interested in that today.  06/30/2020  Craig Townsend is seen today in follow-up. Is actually been a year and a half since I last saw him. He underwent calcium scoring in 2020. This demonstrated a calcium score of 224 which was 71st percentile for age and sex matched control. He is noted to more recently been compliant with daily dosing  of rosuvastatin. He is no longer on the 40 mg rather than 5 mg daily. Despite this he has had much better cholesterol. His recent blood work showed total cholesterol of 173, triglycerides 96, HDL 55 and LDL 100. Based on his higher coronary calcium score would advocate even more aggressive therapy to LDL less than 70.  04/04/2021  Craig Townsend returns today for follow-up.  Overall seems to be doing well.  His lipids recently showed total cholesterol 173, triglycerides 96, HDL 55 and LDL 100.  Blood pressure was slightly elevated today 140/80.  He is taking  rosuvastatin 40 mg daily and the last lipids were representing 20 mg of rosuvastatin.  He is interested in having his lipids rechecked.  07/28/2021  Craig Townsend is seen today in follow-up.  He recently had upper respiratory infection was seen in urgent care.  Blood pressure was noted to be high about 150/100.  He was recommended to see his cardiologist and is here today for follow-up.  Blood pressure in the office today much better 141/78.  He has had a little bit of weight gain and less physical activity.  02/08/2023  Craig Townsend returns today for follow-up.  Overall he says he is feeling pretty well.  Reports he had COVID a few weeks ago and took Paxlovid for that.  He has totally recovered, however.  Back in January he had lab work which showed elevated AST and ALT.  These were 58 and 101 respectively.  Subsequently had repeat liver enzymes 2 days ago which showed AST 90 and ALT 147.  He is on rosuvastatin.  No history of underlying steatohepatitis.  His last CT scan showed no evidence of that his had 2 abdominal ultrasounds in the last 20 years which were negative as well.  He is not complaining of any abdominal pain or change in bowel habits.  PMHx:  Past Medical History:  Diagnosis Date   Acute sinusitis    Asthma    CAD (coronary artery disease)    mild   Dyslipidemia    Family history of heart disease    History of nuclear stress test 05/2007   bruce myoview; normal pattern of perfusion in all regions, post-stress EF 74%, normal low risk study    Hyperlipidemia    Impaired glucose tolerance     Past Surgical History:  Procedure Laterality Date   APPENDECTOMY  10/2004   exploratory lap, resection of Meckel's diverticulum (Dr. Biagio Borg)    PARATHYROIDECTOMY     suprapubic incision     ? volvulus    TRANSTHORACIC ECHOCARDIOGRAM  2003   EF 60%, trivial TR    FAMHx:  Family History  Problem Relation Age of Onset   Asthma Father        deceased at age 61   Heart disease Father 75    Kidney disease Sister     SOCHx:   reports that he quit smoking about 24 years ago. His smoking use included cigarettes. He has never used smokeless tobacco. He reports current alcohol use. He reports that he does not use drugs.  ALLERGIES:  No Known Allergies  ROS: A comprehensive review of systems was negative.  HOME MEDS: Current Outpatient Medications  Medication Sig Dispense Refill   aspirin 81 MG tablet Take 81 mg by mouth daily.     cetirizine (ZYRTEC) 10 MG tablet Take 10 mg by mouth daily.     famotidine (PEPCID) 20 MG tablet Take 20 mg by mouth daily.  rosuvastatin (CRESTOR) 20 MG tablet Take 1 tablet (20 mg total) by mouth daily. Please keep scheduled appointment for further refills 90 tablet 3   sildenafil (REVATIO) 20 MG tablet 3-5 pills one hour before planned intercourse     testosterone cypionate (DEPOTESTOSTERONE CYPIONATE) 200 MG/ML injection Inject 200 mg into the muscle every 14 (fourteen) days.     No current facility-administered medications for this visit.    LABS/IMAGING: No results found for this or any previous visit (from the past 48 hour(s)). No results found.  VITALS: BP 124/84   Pulse 74   Ht 5\' 11"  (1.803 m)   Wt 206 lb 12.8 oz (93.8 kg)   SpO2 94%   BMI 28.84 kg/m   EXAM: General appearance: alert and no distress Neck: no carotid bruit and no JVD Lungs: clear to auscultation bilaterally Heart: regular rate and rhythm, S1, S2 normal, no murmur, click, rub or Townsend Abdomen: soft, non-tender; bowel sounds normal; no masses,  no organomegaly Extremities: extremities normal, atraumatic, no cyanosis or edema Pulses: 2+ and symmetric Skin: Skin color, texture, turgor normal. No rashes or lesions Neurologic: Grossly normal Psych: Mood, affect normal  EKG: EKG Interpretation Date/Time:  Friday February 08 2023 08:53:35 EDT Ventricular Rate:  74 PR Interval:  180 QRS Duration:  56 QT Interval:  372 QTC Calculation: 412 R  Axis:   -7  Text Interpretation: Normal sinus rhythm Low voltage QRS Possible Inferior infarct , age undetermined When compared with ECG of 19-Oct-2004 09:14, Questionable change in QRS axis T wave amplitude has decreased in Lateral leads Confirmed by Zoila Shutter [phone]) on 02/08/2023 9:07:05 AM    ASSESSMENT: Dyslipidemia Family history of premature coronary disease Elevated CAC score of 224 (71st %) - 02/2019 Elevated liver enzymes  PLAN: 1.   Craig Townsend seems to be doing well without chest pain or shortness of breath.  He had some elevated liver enzymes earlier this year which are even higher based on the lab work from 2 days ago.  I advised him to stop his rosuvastatin as they are more than 3 times the upper limit of normal.  Will get a ultrasound of the right upper quadrant to rule out any acute cause of his liver enzyme elevation.  With the rise and no specific change in his statin medication, I suspect there is another etiology.  Recent use of the Paxlovid may have provoked this, as could have the COVID virus itself.  Plan follow-up in about 3 months.  Chrystie Nose, MD, [facility], FACP  [place]  [facility]  Medical Director of the Advanced Lipid Disorders &  Cardiovascular Risk Reduction Clinic Diplomate of the American Board of Clinical Lipidology Attending Cardiologist  Direct Dial: [phone]  Fax: [phone]  Website:  [email or web address] 02/08/2023, 9:07 AM

## 2023-02-13 ENCOUNTER — Ambulatory Visit
Admission: RE | Admit: 2023-02-13 | Discharge: 2023-02-13 | Disposition: A | Discharge status: Home / Self Care | Payer: Medicare Other | Source: Ambulatory Visit | Attending: Internal Medicine | Admitting: Internal Medicine

## 2023-02-13 ENCOUNTER — Telehealth: Payer: Self-pay

## 2023-02-13 DIAGNOSIS — R748 Abnormal levels of other serum enzymes: Secondary | ICD-10-CM

## 2023-02-13 NOTE — Telephone Encounter (Addendum)
Results discussed with patient with Dr. Rennis Golden. Patient had understanding of results.----- Message from Cannon Kettle sent at 02/11/2023 10:22 PM EDT ----- Results discussed by Dr. Rennis Golden.   Stop crestor and check right upper quadrant ultrasound.

## 2023-05-21 LAB — HEPATIC FUNCTION PANEL
ALT: 30 [IU]/L (ref 0–44)
AST: 19 [IU]/L (ref 0–40)
Albumin: 4 g/dL (ref 3.9–4.9)
Alkaline Phosphatase: 84 [IU]/L (ref 44–121)
Bilirubin Total: 0.8 mg/dL (ref 0.0–1.2)
Bilirubin, Direct: 0.21 mg/dL (ref 0.00–0.40)
Total Protein: 6.6 g/dL (ref 6.0–8.5)

## 2023-05-23 ENCOUNTER — Ambulatory Visit: Payer: Medicare Other | Attending: Internal Medicine | Admitting: Internal Medicine

## 2023-05-23 VITALS — BP 148/92 | HR 85 | Ht 71.0 in | Wt 211.0 lb

## 2023-05-23 DIAGNOSIS — R748 Abnormal levels of other serum enzymes: Secondary | ICD-10-CM

## 2023-05-23 DIAGNOSIS — R931 Abnormal findings on diagnostic imaging of heart and coronary circulation: Secondary | ICD-10-CM | POA: Diagnosis not present

## 2023-05-23 DIAGNOSIS — E785 Hyperlipidemia, unspecified: Secondary | ICD-10-CM

## 2023-05-23 MED ORDER — ROSUVASTATIN CALCIUM 5 MG PO TABS
5.0000 mg | ORAL_TABLET | Freq: Every day | ORAL | 3 refills | Status: DC
Start: 2023-05-23 — End: 2024-03-25

## 2023-05-23 NOTE — Progress Notes (Signed)
OFFICE NOTE  Chief Complaint:  No complaints  Primary Care Physician: [name], L.[name], MD (Inactive)  HPI:  Craig Townsend  is a 67 year old gentleman here for an annual check-up. He has a history of dyslipidemia and mild coronary disease. We have been medically managing him, and he had actually a fairly good NMR profile last year with a particle number of 1080. LDL was 52 on Crestor 5 at bedtime. Unfortunately, he has impaired fasting glucose and with a two-hour glucose tolerance test, the glucose peaked at 196, down to 141. He has not yet required medications for that. Overall the past year, he has had no worsening shortness of breath, chest pain, palpitations, presyncope, or syncopal symptoms. He has been exercising and reported that his fasting glucose is now down to 90. He continues to take Crestor without any difficulty and is due for a lipid profile.  Also of note he was recently diagnosed with a right inguinal hernia which is considered small and he was not advised to undergo surgical repair.  Craig Townsend has no new complaints. He denies any chest pain or worsening shortness of breath. Continues to be quite active.  I saw Craig Townsend back in the office today. He apparently had a recent EKG which there was poor R-wave progression in the septal leads concerning for possible septal infarct. I felt that this likely was recently lead placement. We repeated the EKG in the office today and there is no evidence of prior MI. He's also had no symptoms to suggest that he's had a coronary event. The fact he is doing quite well. I did provide a letter to his employer regarding of the abnormalities on his EKG which I think are artifactual.  04/24/2016  Craig Townsend is seen today in follow-up. Over the past year he's done well. He denies any chest pain or worsening shortness of breath. He is due for repeat lipid profile. Blood pressure was mildly elevated today however came down to 130/78. Continues to  be physically active without any restrictions.  10/21/2017  Craig Townsend returns today for follow-up.  In general he feels like he is doing well.  He denies chest pain or shortness of breath.  He is overdue for a lipid profile.  Blood pressure is noted to be reasonably controlled today 134/82.  He has gained a little bit of weight.  He reports decreased activity.  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  EKG today shows sinus rhythm.  02/03/2019  Craig Townsend is seen today in follow-up.  Overall he continues to do well.  Denies any chest pain or worsening shortness of breath.  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  He denies any palpitations though was noted to have some PVCs on his EKG today.  He is on low-dose Crestor.  He also takes daily aspirin.  He does have a family history of premature coronary disease however has had no notable early onset heart disease.  We discussed possibly re-stratifying him further with coronary artery calcium scoring.  He seemed interested in that today.  06/30/2020  Craig Townsend is seen today in follow-up. Is actually been a year and a half since I last saw him. He underwent calcium scoring in 2020. This demonstrated a calcium score of 224 which was 71st percentile for age and sex matched control. He is noted to more recently been compliant with daily  dosing of rosuvastatin. He is no longer on the 40 mg rather than 5 mg daily. Despite this he has had much better cholesterol. His recent blood work showed total cholesterol of 173, triglycerides 96, HDL 55 and LDL 100. Based on his higher coronary calcium score would advocate even more aggressive therapy to LDL less than 70.  04/04/2021  Craig Townsend returns today for follow-up.  Overall seems to be doing well.  His lipids recently showed total cholesterol 173, triglycerides 96, HDL 55 and LDL 100.  Blood pressure was slightly elevated today 140/80.  He is  taking rosuvastatin 40 mg daily and the last lipids were representing 20 mg of rosuvastatin.  He is interested in having his lipids rechecked.  07/28/2021  Craig Townsend is seen today in follow-up.  He recently had upper respiratory infection was seen in urgent care.  Blood pressure was noted to be high about 150/100.  He was recommended to see his cardiologist and is here today for follow-up.  Blood pressure in the office today much better 141/78.  He has had a little bit of weight gain and less physical activity.  02/08/2023  Craig Townsend returns today for follow-up.  Overall he says he is feeling pretty well.  Reports he had COVID a few weeks ago and took Paxlovid for that.  He has totally recovered, however.  Back in January he had lab work which showed elevated AST and ALT.  These were 58 and 101 respectively.  Subsequently had repeat liver enzymes 2 days ago which showed AST 90 and ALT 147.  He is on rosuvastatin.  No history of underlying steatohepatitis.  His last CT scan showed no evidence of that his had 2 abdominal ultrasounds in the last 20 years which were negative as well.  He is not complaining of any abdominal pain or change in bowel habits.  05/23/2023  Craig Townsend is seen today in follow-up.  Fortunately has had normalization of his liver enzymes off statin therapy.  Recent AST and ALT were 19 and 30, respectively.  He had a liver ultrasound which showed no significant findings other than some fatty liver.  I suspect that his elevated liver enzymes were combination of statin and COVID infection.  Possibly worsened by treatment with Paxlovid.  At this point however he likely has marked increases in his cholesterol since he has been off of the rosuvastatin for several months.  PMHx:  Past Medical History:  Diagnosis Date   Acute sinusitis    Asthma    CAD (coronary artery disease)    mild   Dyslipidemia    Family history of heart disease    History of nuclear stress test 05/2007    bruce myoview; normal pattern of perfusion in all regions, post-stress EF 74%, normal low risk study    Hyperlipidemia    Impaired glucose tolerance     Past Surgical History:  Procedure Laterality Date   APPENDECTOMY  10/2004   exploratory lap, resection of Meckel's diverticulum (Dr. Biagio Borg)    PARATHYROIDECTOMY     suprapubic incision     ? volvulus    TRANSTHORACIC ECHOCARDIOGRAM  2003   EF 60%, trivial TR    FAMHx:  Family History  Problem Relation Age of Onset   Asthma Father        deceased at age 44   Heart disease Father 60   Kidney disease Sister     SOCHx:   reports that he quit smoking about 25  years ago. His smoking use included cigarettes. He has never used smokeless tobacco. He reports current alcohol use. He reports that he does not use drugs.  ALLERGIES:  No Known Allergies  ROS: A comprehensive review of systems was negative.  HOME MEDS: Current Outpatient Medications  Medication Sig Dispense Refill   aspirin 81 MG tablet Take 81 mg by mouth daily.     cetirizine (ZYRTEC) 10 MG tablet Take 10 mg by mouth daily.     famotidine (PEPCID) 20 MG tablet Take 20 mg by mouth daily.     rosuvastatin (CRESTOR) 20 MG tablet Take 20 mg by mouth at bedtime.     sildenafil (REVATIO) 20 MG tablet 3-5 pills one hour before planned intercourse     testosterone cypionate (DEPOTESTOSTERONE CYPIONATE) 200 MG/ML injection Inject 200 mg into the muscle every 14 (fourteen) days.     No current facility-administered medications for this visit.    LABS/IMAGING: No results found for this or any previous visit (from the past 48 hours). No results found.  VITALS: BP (!) 148/92 (BP Location: Left Arm, Patient Position: Sitting, Cuff Size: Normal)   Pulse 85   Ht 5\' 11"  (1.803 m)   Wt 211 lb (95.7 kg)   SpO2 94%   BMI 29.43 kg/m   EXAM: General appearance: alert and no distress Neck: no carotid bruit and no JVD Lungs: clear to auscultation bilaterally Heart:  regular rate and rhythm, S1, S2 normal, no murmur, click, rub or gallop Abdomen: soft, non-tender; bowel sounds normal; no masses,  no organomegaly Extremities: extremities normal, atraumatic, no cyanosis or edema Pulses: 2+ and symmetric Skin: Skin color, texture, turgor normal. No rashes or lesions Neurologic: Grossly normal Psych: Mood, affect normal  EKG: Deferred  ASSESSMENT: Dyslipidemia, goal LDL less than 70 Family history of premature coronary disease Elevated CAC score of 224 (71st %) - 02/2019 Elevated liver enzymes - fatty liver  PLAN: 1.   Craig Townsend has had normalization of his liver enzymes.  He will need to be back on some therapy for lipid lowering.  I would advise low-dose rosuvastatin 5 mg daily instead of his 20 mg dose.  Will monitor his liver enzymes closely.  Repeat LFTs in 2 weeks.  If tolerated with no changes plan repeat lipids in about 3 months.  I suspect he will need additional therapy to reach a target LDL less than 70.  Follow-up with me in 3 months or sooner as necessary.  Chrystie Nose, MD, [facility], FACP  [place]  [facility]  Medical Director of the Advanced Lipid Disorders &  Cardiovascular Risk Reduction Clinic Diplomate of the American Board of Clinical Lipidology Attending Cardiologist  Direct Dial: [phone]  Fax: [phone]  Website:  [email or web address] 05/23/2023, 10:46 AM

## 2023-05-23 NOTE — Patient Instructions (Signed)
Medication Instructions:  Change crestor from 20 mg to 5 mg daily *If you need a refill on your cardiac medications before your next appointment, please call your pharmacy*   Lab Work: LFT in 2 weeks  Lipids in 3 months  If you have labs (blood work) drawn today and your tests are completely normal, you will receive your results only by: MyChart Message (if you have MyChart) OR A paper copy in the mail If you have any lab test that is abnormal or we need to change your treatment, we will call you to review the results.   Testing/Procedures: None    Follow-Up: At [facility], you and your health needs are our priority.  As part of our continuing mission to provide you with exceptional heart care, we have created designated Provider Care Teams.  These Care Teams include your primary Cardiologist (physician) and Advanced Practice Providers (APPs -  Physician Assistants and Nurse Practitioners) who all work together to provide you with the care you need, when you need it.   Your next appointment:   3 month(s)  Provider:   Chrystie Nose, MD

## 2023-08-03 LAB — HEPATIC FUNCTION PANEL
ALT: 63 IU/L — ABNORMAL HIGH (ref 0–44)
AST: 38 IU/L (ref 0–40)
Albumin: 4.1 g/dL (ref 3.9–4.9)
Alkaline Phosphatase: 117 IU/L (ref 44–121)
Bilirubin Total: 0.7 mg/dL (ref 0.0–1.2)
Bilirubin, Direct: 0.25 mg/dL (ref 0.00–0.40)
Total Protein: 6.8 g/dL (ref 6.0–8.5)

## 2023-08-09 LAB — LIPID PANEL
Chol/HDL Ratio: 3.3 ratio (ref 0.0–5.0)
Cholesterol, Total: 152 mg/dL (ref 100–199)
HDL: 46 mg/dL (ref >39)
LDL Chol Calc (NIH): 91 mg/dL (ref 0–99)
Triglycerides: 78 mg/dL (ref 0–149)
VLDL Cholesterol Cal: 15 mg/dL (ref 5–40)

## 2023-08-12 ENCOUNTER — Ambulatory Visit: Payer: Medicare Other | Attending: Internal Medicine | Admitting: Internal Medicine

## 2023-08-12 ENCOUNTER — Encounter: Payer: Self-pay | Admitting: Internal Medicine

## 2023-08-12 VITALS — BP 144/86 | HR 69 | Ht 71.0 in | Wt 207.8 lb

## 2023-08-12 DIAGNOSIS — R931 Abnormal findings on diagnostic imaging of heart and coronary circulation: Secondary | ICD-10-CM

## 2023-08-12 DIAGNOSIS — R748 Abnormal levels of other serum enzymes: Secondary | ICD-10-CM | POA: Diagnosis not present

## 2023-08-12 DIAGNOSIS — I10 Essential (primary) hypertension: Secondary | ICD-10-CM | POA: Diagnosis not present

## 2023-08-12 DIAGNOSIS — R079 Chest pain, unspecified: Secondary | ICD-10-CM | POA: Diagnosis not present

## 2023-08-12 DIAGNOSIS — E785 Hyperlipidemia, unspecified: Secondary | ICD-10-CM | POA: Diagnosis not present

## 2023-08-12 MED ORDER — EZETIMIBE 10 MG PO TABS: 10.0000 mg | ORAL_TABLET | Freq: Every day | ORAL | 3 refills | Status: AC

## 2023-08-12 NOTE — Progress Notes (Signed)
 OFFICE NOTE  Chief Complaint:  No complaints  Primary Care Physician: [name], L.[name], MD (Inactive)  HPI:  Craig Townsend  is a 68 year old gentleman here for an annual check-up. He has a history of dyslipidemia and mild coronary disease. We have been medically managing him, and he had actually a fairly good NMR profile last year with a particle number of 1080. LDL was 52 on Crestor 5 at bedtime. Unfortunately, he has impaired fasting glucose and with a two-hour glucose tolerance test, the glucose peaked at 196, down to 141. He has not yet required medications for that. Overall the past year, he has had no worsening shortness of breath, chest pain, palpitations, presyncope, or syncopal symptoms. He has been exercising and reported that his fasting glucose is now down to 90. He continues to take Crestor without any difficulty and is due for a lipid profile.  Also of note he was recently diagnosed with a right inguinal hernia which is considered small and he was not advised to undergo surgical repair.  Craig Townsend has no new complaints. He denies any chest pain or worsening shortness of breath. Continues to be quite active.  I saw Craig Townsend back in the office today. He apparently had a recent EKG which there was poor R-wave progression in the septal leads concerning for possible septal infarct. I felt that this likely was recently lead placement. We repeated the EKG in the office today and there is no evidence of prior MI. He's also had no symptoms to suggest that he's had a coronary event. The fact he is doing quite well. I did provide a letter to his employer regarding of the abnormalities on his EKG which I think are artifactual.  04/24/2016  Craig Townsend is seen today in follow-up. Over the past year he's done well. He denies any chest pain or worsening shortness of breath. He is due for repeat lipid profile. Blood pressure was mildly elevated today however came down to 130/78. Continues to  be physically active without any restrictions.  10/21/2017  Craig Townsend returns today for follow-up.  In general he feels like he is doing well.  He denies chest pain or shortness of breath.  He is overdue for a lipid profile.  Blood pressure is noted to be reasonably controlled today 134/82.  He has gained a little bit of weight.  He reports decreased activity.  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  EKG today shows sinus rhythm.  02/03/2019  Craig Townsend is seen today in follow-up.  Overall he continues to do well.  Denies any chest pain or worsening shortness of breath.  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  He denies any palpitations though was noted to have some PVCs on his EKG today.  He is on low-dose Crestor.  He also takes daily aspirin.  He does have a family history of premature coronary disease however has had no notable early onset heart disease.  We discussed possibly re-stratifying him further with coronary artery calcium scoring.  He seemed interested in that today.  06/30/2020  Craig Townsend is seen today in follow-up. Is actually been a year and a half since I last saw him. He underwent calcium scoring in 2020. This demonstrated a calcium score of 224 which was 71st percentile for age and sex matched control. He is noted to more recently been compliant with daily  dosing of rosuvastatin. He is no longer on the 40 mg rather than 5 mg daily. Despite this he has had much better cholesterol. His recent blood work showed total cholesterol of 173, triglycerides 96, HDL 55 and LDL 100. Based on his higher coronary calcium score would advocate even more aggressive therapy to LDL less than 70.  04/04/2021  Craig Townsend returns today for follow-up.  Overall seems to be doing well.  His lipids recently showed total cholesterol 173, triglycerides 96, HDL 55 and LDL 100.  Blood pressure was slightly elevated today 140/80.  He is  taking rosuvastatin 40 mg daily and the last lipids were representing 20 mg of rosuvastatin.  He is interested in having his lipids rechecked.  07/28/2021  Craig Townsend is seen today in follow-up.  He recently had upper respiratory infection was seen in urgent care.  Blood pressure was noted to be high about 150/100.  He was recommended to see his cardiologist and is here today for follow-up.  Blood pressure in the office today much better 141/78.  He has had a little bit of weight gain and less physical activity.  02/08/2023  Craig Townsend returns today for follow-up.  Overall he says he is feeling pretty well.  Reports he had COVID a few weeks ago and took Paxlovid for that.  He has totally recovered, however.  Back in January he had lab work which showed elevated AST and ALT.  These were 58 and 101 respectively.  Subsequently had repeat liver enzymes 2 days ago which showed AST 90 and ALT 147.  He is on rosuvastatin.  No history of underlying steatohepatitis.  His last CT scan showed no evidence of that his had 2 abdominal ultrasounds in the last 20 years which were negative as well.  He is not complaining of any abdominal pain or change in bowel habits.  05/23/2023  Craig Townsend is seen today in follow-up.  Fortunately has had normalization of his liver enzymes off statin therapy.  Recent AST and ALT were 19 and 30, respectively.  He had a liver ultrasound which showed no significant findings other than some fatty liver.  I suspect that his elevated liver enzymes were combination of statin and COVID infection.  Possibly worsened by treatment with Paxlovid.  At this point however he likely has marked increases in his cholesterol since he has been off of the rosuvastatin for several months.  08/12/2023  Craig Townsend is seen today for follow-up.  I cut back his statin to 5 mg rosuvastatin because of elevated liver enzymes.  These had improved although his ALT now is at 63.  LDL is gone up accordingly to 91 with  total cholesterol 152, HDL 46 and triglycerides 78.  He still not at target.  He does tell me he had an episode where he contemplated going to the emergency department.  He had some discomfort at the left neck base that went down the left arm including intermittent numbness and tingling as well as to the left jaw.  This was not worse with exertion or relieved by rest and lasted for several days but then ultimately resolved.  I suspect this is likely a cervical radiculopathy but could be consistent with angina.  His last stress test was in 2008.  He does have known coronary artery calcification.  PMHx:  Past Medical History:  Diagnosis Date   Acute sinusitis    Asthma    CAD (coronary artery disease)    mild  Dyslipidemia    Family history of heart disease    History of nuclear stress test 05/2007   bruce myoview; normal pattern of perfusion in all regions, post-stress EF 74%, normal low risk study    Hyperlipidemia    Impaired glucose tolerance     Past Surgical History:  Procedure Laterality Date   APPENDECTOMY  10/2004   exploratory lap, resection of Meckel's diverticulum (Dr. Biagio Borg)    PARATHYROIDECTOMY     suprapubic incision     ? volvulus    TRANSTHORACIC ECHOCARDIOGRAM  2003   EF 60%, trivial TR    FAMHx:  Family History  Problem Relation Age of Onset   Asthma Father        deceased at age 108   Heart disease Father 21   Kidney disease Sister     SOCHx:   reports that he quit smoking about 25 years ago. His smoking use included cigarettes. He has never used smokeless tobacco. He reports current alcohol use. He reports that he does not use drugs.  ALLERGIES:  No Known Allergies  ROS: A comprehensive review of systems was negative.  HOME MEDS: Current Outpatient Medications  Medication Sig Dispense Refill   aspirin 81 MG tablet Take 81 mg by mouth daily.     cetirizine (ZYRTEC) 10 MG tablet Take 10 mg by mouth daily.     famotidine (PEPCID) 20 MG tablet Take  20 mg by mouth daily.     rosuvastatin (CRESTOR) 5 MG tablet Take 1 tablet (5 mg total) by mouth daily. 90 tablet 3   sildenafil (REVATIO) 20 MG tablet 3-5 pills one hour before planned intercourse     testosterone cypionate (DEPOTESTOSTERONE CYPIONATE) 200 MG/ML injection Inject 200 mg into the muscle every 14 (fourteen) days.     No current facility-administered medications for this visit.    LABS/IMAGING: No results found for this or any previous visit (from the past 48 hours). No results found.  VITALS: BP (!) 144/86 (BP Location: Left Arm, Patient Position: Sitting, Cuff Size: Normal)   Pulse 69   Ht 5\' 11"  (1.803 m)   Wt 207 lb 12.8 oz (94.3 kg)   SpO2 94%   BMI 28.98 kg/m   EXAM: General appearance: alert and no distress Neck: no carotid bruit and no JVD Lungs: clear to auscultation bilaterally Heart: regular rate and rhythm, S1, S2 normal, no murmur, click, rub or gallop Abdomen: soft, non-tender; bowel sounds normal; no masses,  no organomegaly Extremities: extremities normal, atraumatic, no cyanosis or edema Pulses: 2+ and symmetric Skin: Skin color, texture, turgor normal. No rashes or lesions Neurologic: Grossly normal Psych: Mood, affect normal  EKG: EKG Interpretation Date/Time:  Monday August 12 2023 11:20:16 EDT Ventricular Rate:  69 PR Interval:  168 QRS Duration:  64 QT Interval:  404 QTC Calculation: 432 R Axis:   90  Text Interpretation: Sinus rhythm with occasional Premature ventricular complexes Rightward axis Low voltage QRS Cannot rule out Anterior infarct , age undetermined When compared with ECG of 08-Feb-2023 08:53, Premature ventricular complexes are now Present Minimal criteria for Anterior infarct are now Present Borderline criteria for Inferior infarct are no longer Present Confirmed by Zoila Shutter [phone]) on 08/12/2023 11:23:34 AM    ASSESSMENT: Chest and left arm pain Dyslipidemia, goal LDL less than 70 Family history of premature  coronary disease Elevated CAC score of 224 (71st %) - 02/2019 Elevated liver enzymes - fatty liver  PLAN: 1.   Craig. Heal has  had improvement in liver enzymes although not normalized on lower dose statin.  He still above target LDL less than 70 and I would advise adding Zetia to low-dose rosuvastatin and we will repeat liver enzymes and lipids.  He describes some chest discomfort which has some atypical features but did radiate to his jaw and included some numbness in his left arm.  His last stress test was in 2008.  He has a calcium score that was 71st percentile in 2020.  There is also family history of early onset heart disease.  I would advise an exercise Myoview stress test.  He does exercise regularly but mostly with weightlifting.  He is not symptomatic with that.  PVCs were noted on his EKG today and he is unaware of these.  Plan follow-up with me annually or sooner as necessary unless his stress test is abnormal.  Chrystie Nose, MD, [facility], FACP  [place]  [facility]  Medical Director of the [facility] Lipid [facility] &  Cardiovascular Risk Reduction Clinic Diplomate of the American Board of Clinical Lipidology Attending Cardiologist  Direct Dial: [phone]  Fax: [phone]  Website:  [email or web address] Nicely Yvonne Petite 08/12/2023, 11:23 AM

## 2023-08-12 NOTE — Patient Instructions (Addendum)
 Medication Instructions:  START zetia 10mg  once daily for cholesterol -- continue all other current meds  *If you need a refill on your cardiac medications before your next appointment, please call your pharmacy*   Lab Work: AFTER ONE MONTH on zetia -- complete non-fasting hepatic function panel   AFTER 3-4 MONTHS on zetia -- complete fasting lipid panel   If you have labs (blood work) drawn today and your tests are completely normal, you will receive your results only by: MyChart Message (if you have MyChart) OR A paper copy in the mail If you have any lab test that is abnormal or we need to change your treatment, we will call you to review the results.   Testing/Procedures: Dr. Rennis Golden has ordered a Myocardial Perfusion Imaging Study.   The test will take approximately 3 to 4 hours to complete; you may bring reading material.  If someone comes with you to your appointment, they will need to remain in the main lobby due to limited space in the testing area. **If you are pregnant or breastfeeding, please notify the nuclear lab prior to your appointment**    How to prepare for your Myocardial Perfusion Test: Do not eat or drink 3 hours prior to your test, except you may have water. Do not consume products containing caffeine (regular or decaffeinated) 12 hours prior to your test. (ex: coffee, chocolate, sodas, tea). Do wear comfortable clothes (no dresses or overalls) and walking shoes, tennis shoes preferred (No heels or open toe shoes are allowed). Do NOT wear cologne, perfume, aftershave, or lotions (deodorant is allowed). If these instructions are not followed, your test will have to be rescheduled.    Follow-Up: At [facility], you and your health needs are our priority.  As part of our continuing mission to provide you with exceptional heart care, we have created designated Provider Care Teams.  These Care Teams include your primary Cardiologist (physician) and Advanced  Practice Providers (APPs -  Physician Assistants and Nurse Practitioners) who all work together to provide you with the care you need, when you need it.  We recommend signing up for the patient portal called "MyChart".  Sign up information is provided on this After Visit Summary.  MyChart is used to connect with patients for Virtual Visits (Telemedicine).  Patients are able to view lab/test results, encounter notes, upcoming appointments, etc.  Non-urgent messages can be sent to your provider as well.   To learn more about what you can do with MyChart, go to [email or web address].    Your next appointment:    12 months with Dr. Rennis Golden Other Instructions    1st Floor: - Lobby - Registration  - Pharmacy  - Lab - Cafe  2nd Floor: - PV Lab - Diagnostic Testing (echo, CT, nuclear med)  3rd Floor: - Vacant  4th Floor: - TCTS (cardiothoracic surgery) - AFib Clinic - Structural Heart Clinic - Vascular Surgery  - Vascular Ultrasound  5th Floor: - HeartCare Cardiology (general and EP) - Clinical Pharmacy for coumadin, hypertension, lipid, weight-loss medications, and med management appointments    Valet parking services will be available as well.

## 2023-08-20 ENCOUNTER — Encounter (HOSPITAL_COMMUNITY): Payer: Self-pay

## 2023-08-28 ENCOUNTER — Ambulatory Visit (HOSPITAL_COMMUNITY)

## 2023-09-12 ENCOUNTER — Telehealth (HOSPITAL_COMMUNITY): Payer: Self-pay | Admitting: *Deleted

## 2023-09-12 NOTE — Telephone Encounter (Signed)
 Per DPR left detailed instructions for MPI study on cell #.

## 2023-09-19 ENCOUNTER — Ambulatory Visit (HOSPITAL_COMMUNITY)

## 2023-09-23 ENCOUNTER — Telehealth (HOSPITAL_COMMUNITY): Payer: Self-pay | Admitting: Internal Medicine

## 2023-09-23 NOTE — Telephone Encounter (Signed)
 Patient cancelled Myoview x 2 for reason below:  09/17/2023 1:55 PM By: Berniece Brisk  Cancel Rsn: Patient (appt conflict/will c/b to r/s) x 2   Order will be removed from the WQ and if patient calls back we will reinstate the order. Thank you.

## 2023-10-16 ENCOUNTER — Encounter: Payer: Self-pay | Admitting: Internal Medicine

## 2023-11-23 LAB — HEPATIC FUNCTION PANEL
ALT: 30 IU/L (ref 0–44)
AST: 25 IU/L (ref 0–40)
Albumin: 4.2 g/dL (ref 3.9–4.9)
Alkaline Phosphatase: 79 IU/L (ref 44–121)
Bilirubin Total: 0.6 mg/dL (ref 0.0–1.2)
Bilirubin, Direct: 0.24 mg/dL (ref 0.00–0.40)
Total Protein: 6.8 g/dL (ref 6.0–8.5)

## 2023-11-24 ENCOUNTER — Ambulatory Visit (HOSPITAL_BASED_OUTPATIENT_CLINIC_OR_DEPARTMENT_OTHER): Payer: Self-pay | Admitting: Internal Medicine

## 2023-12-18 ENCOUNTER — Encounter: Payer: Self-pay | Admitting: Internal Medicine

## 2024-01-09 ENCOUNTER — Encounter: Payer: Self-pay | Admitting: Internal Medicine

## 2024-03-24 ENCOUNTER — Other Ambulatory Visit: Payer: Self-pay | Admitting: Internal Medicine

## 2024-03-30 ENCOUNTER — Encounter (HOSPITAL_COMMUNITY): Payer: Self-pay

## 2024-03-30 ENCOUNTER — Emergency Department (HOSPITAL_COMMUNITY)
Admission: EM | Admit: 2024-03-30 | Discharge: 2024-03-30 | Disposition: A | Discharge status: Home / Self Care | Attending: Emergency Medicine | Admitting: Emergency Medicine

## 2024-03-30 ENCOUNTER — Other Ambulatory Visit: Payer: Self-pay

## 2024-03-30 ENCOUNTER — Emergency Department (HOSPITAL_COMMUNITY)

## 2024-03-30 DIAGNOSIS — R112 Nausea with vomiting, unspecified: Secondary | ICD-10-CM | POA: Insufficient documentation

## 2024-03-30 DIAGNOSIS — R42 Dizziness and giddiness: Secondary | ICD-10-CM | POA: Diagnosis present

## 2024-03-30 DIAGNOSIS — Z7982 Long term (current) use of aspirin: Secondary | ICD-10-CM | POA: Diagnosis not present

## 2024-03-30 DIAGNOSIS — Z79899 Other long term (current) drug therapy: Secondary | ICD-10-CM | POA: Insufficient documentation

## 2024-03-30 DIAGNOSIS — I6782 Cerebral ischemia: Secondary | ICD-10-CM | POA: Insufficient documentation

## 2024-03-30 LAB — COMPREHENSIVE METABOLIC PANEL WITH GFR
ALT: 87 U/L — ABNORMAL HIGH (ref 0–44)
AST: 42 U/L — ABNORMAL HIGH (ref 15–41)
Albumin: 4.1 g/dL (ref 3.5–5.0)
Alkaline Phosphatase: 112 U/L (ref 38–126)
Anion gap: 10 (ref 5–15)
BUN: 10 mg/dL (ref 8–23)
CO2: 25 mmol/L (ref 22–32)
Calcium: 9.1 mg/dL (ref 8.9–10.3)
Chloride: 101 mmol/L (ref 98–111)
Creatinine, Ser: 0.89 mg/dL (ref 0.61–1.24)
GFR, Estimated: >60 mL/min (ref >60)
Glucose, Bld: 153 mg/dL — ABNORMAL HIGH (ref 70–99)
Potassium: 3.5 mmol/L (ref 3.5–5.1)
Sodium: 136 mmol/L (ref 135–145)
Total Bilirubin: 0.6 mg/dL (ref 0.0–1.2)
Total Protein: 6.9 g/dL (ref 6.5–8.1)

## 2024-03-30 LAB — CBC
HCT: 45.7 % (ref 39.0–52.0)
Hemoglobin: 15.9 g/dL (ref 13.0–17.0)
MCH: 31.9 pg (ref 26.0–34.0)
MCHC: 34.8 g/dL (ref 30.0–36.0)
MCV: 91.8 fL (ref 80.0–100.0)
Platelets: 241 K/uL (ref 150–400)
RBC: 4.98 MIL/uL (ref 4.22–5.81)
RDW: 14 % (ref 11.5–15.5)
WBC: 9.5 K/uL (ref 4.0–10.5)
nRBC: 0 % (ref 0.0–0.2)

## 2024-03-30 LAB — DIFFERENTIAL
Abs Immature Granulocytes: 0.01 K/uL (ref 0.00–0.07)
Basophils Absolute: 0 K/uL (ref 0.0–0.1)
Basophils Relative: 0 %
Eosinophils Absolute: 0.2 K/uL (ref 0.0–0.5)
Eosinophils Relative: 2 %
Immature Granulocytes: 0 %
Lymphocytes Relative: 19 %
Lymphs Abs: 1.8 K/uL (ref 0.7–4.0)
Monocytes Absolute: 0.6 K/uL (ref 0.1–1.0)
Monocytes Relative: 6 %
Neutro Abs: 6.9 K/uL (ref 1.7–7.7)
Neutrophils Relative %: 73 %

## 2024-03-30 LAB — APTT: aPTT: 28 s (ref 24–36)

## 2024-03-30 LAB — PROTIME-INR
INR: 0.9 (ref 0.8–1.2)
Prothrombin Time: 13 s (ref 11.4–15.2)

## 2024-03-30 LAB — ETHANOL: Alcohol, Ethyl (B): <15 mg/dL (ref <15)

## 2024-03-30 LAB — CBG MONITORING, ED: Glucose-Capillary: 168 mg/dL — ABNORMAL HIGH (ref 70–99)

## 2024-03-30 MED ORDER — SODIUM CHLORIDE 0.9% FLUSH: 3.0000 mL | Freq: Once | INTRAVENOUS | Status: DC

## 2024-03-30 MED ORDER — MECLIZINE HCL 12.5 MG PO TABS: 12.5000 mg | ORAL_TABLET | Freq: Three times a day (TID) | ORAL | 0 refills | Status: AC

## 2024-03-30 MED ORDER — MECLIZINE HCL 12.5 MG PO TABS
25.0000 mg | ORAL_TABLET | Freq: Once | ORAL | Status: AC
  Administered 2024-03-30: 25 mg via ORAL
  Filled 2024-03-30: qty 2

## 2024-03-30 NOTE — ED Notes (Signed)
Patient verbalizes understanding of discharge instructions. Opportunity for questioning and answers were provided. Armband removed by staff, pt discharged from ED. Ambulated out to lobby with wife  

## 2024-03-30 NOTE — ED Triage Notes (Signed)
 Pt arrives with c/o dizziness that started at 15:45 today. Per pt, he was getting out of the shower and turned his head and the dizziness started. Pt also reports headache. Pt denies focal deficits, slurred speech, vision changes, or sensation changes. Pt reports walking up stairs and feels like his headache increased.

## 2024-03-30 NOTE — ED Provider Notes (Signed)
 [place] EMERGENCY DEPARTMENT AT [facility] Provider Note   CSN: [id number] Arrival date & time: 03/30/24  8084     Patient presents with: Dizziness   Craig Townsend is a 68 y.o. male.   HPI Patient presents with acute onset nausea dizziness.  Patient was well prior to onset about 4 hours ago.  He notes that he was showering, turned his head suddenly ensued thereafter developed dizziness.  Dizziness was persisting, waxing, waning severity, with associated nausea that worsened with activity.  No chest pain, abdominal pain, dyspnea, confusion, vision changes or asymmetric weakness.  He is here with his girlfriend who assists with the history.  She is a respiratory therapist.     Prior to Admission medications   Medication Sig Start Date End Date Taking? Authorizing Provider  meclizine (ANTIVERT) 12.5 MG tablet Take 1 tablet (12.5 mg total) by mouth 3 (three) times daily for 5 days. 03/30/24 04/04/24 Yes Garrick Charleston, MD  aspirin 81 MG tablet Take 81 mg by mouth daily.    [provider]  cetirizine (ZYRTEC) 10 MG tablet Take 10 mg by mouth daily. 03/12/12   [provider]  ezetimibe  (ZETIA ) 10 MG tablet Take 1 tablet (10 mg total) by mouth daily. 08/12/23 11/10/23  Mona Vinie BROCKS, MD  famotidine (PEPCID) 20 MG tablet Take 20 mg by mouth daily.    [provider]  rosuvastatin  (CRESTOR ) 5 MG tablet TAKE 1 TABLET BY MOUTH DAILY 03/25/24   Hilty, Vinie BROCKS, MD  sildenafil (REVATIO) 20 MG tablet 3-5 pills one hour before planned intercourse 12/25/21   [provider]  testosterone cypionate (DEPOTESTOSTERONE CYPIONATE) 200 MG/ML injection Inject 200 mg into the muscle every 14 (fourteen) days. 07/19/22   [provider]    Allergies: Patient has no known allergies.    Review of Systems  Updated Vital Signs BP 138/66   Pulse 63   Temp 98.2 F (36.8 C) (Oral)   Resp 18   Ht 1.803 m (5' 11)   Wt 90.7 kg   SpO2 96%   BMI  27.89 kg/m   Physical Exam Vitals and nursing note reviewed.  Constitutional:      General: He is not in acute distress.    Appearance: He is well-developed.  HENT:     Head: Normocephalic and atraumatic.  Eyes:     Conjunctiva/sclera: Conjunctivae normal.  Cardiovascular:     Rate and Rhythm: Normal rate and regular rhythm.  Pulmonary:     Effort: Pulmonary effort is normal. No respiratory distress.     Breath sounds: No stridor.  Abdominal:     General: There is no distension.  Skin:    General: Skin is warm and dry.  Neurological:     General: No focal deficit present.     Mental Status: He is alert and oriented to person, place, and time.     Cranial Nerves: No cranial nerve deficit.     Motor: No weakness.     (all labs ordered are listed, but only abnormal results are displayed) Labs Reviewed  COMPREHENSIVE METABOLIC PANEL WITH GFR - Abnormal; Notable for the following components:      Result Value   Glucose, Bld 153 (*)    AST 42 (*)    ALT 87 (*)    All other components within normal limits  CBG MONITORING, ED - Abnormal; Notable for the following components:   Glucose-Capillary 168 (*)    All other components within  normal limits  PROTIME-INR  APTT  CBC  DIFFERENTIAL  ETHANOL    EKG: None  Radiology: CT HEAD WO CONTRAST Result Date: 03/30/2024 CLINICAL DATA:  Headache, neuro deficit EXAM: CT HEAD WITHOUT CONTRAST TECHNIQUE: Contiguous axial images were obtained from the base of the skull through the vertex without intravenous contrast. RADIATION DOSE REDUCTION: This exam was performed according to the departmental dose-optimization program which includes automated exposure control, adjustment of the mA and/or kV according to patient size and/or use of iterative reconstruction technique. COMPARISON:  None Available. FINDINGS: Brain: No intracranial hemorrhage, mass effect, or midline shift. Brain volume is normal for age. No hydrocephalus. The basilar  cisterns are patent. Mild periventricular and deep white matter hypodensity typical of chronic small vessel ischemia. No evidence of territorial infarct or acute ischemia. No extra-axial or intracranial fluid collection. Vascular: Atherosclerosis of skullbase vasculature without hyperdense vessel or abnormal calcification. Skull: No fracture or focal lesion. Sinuses/Orbits: Paranasal sinuses and mastoid air cells are clear. The visualized orbits are unremarkable. Other: None. IMPRESSION: 1. No acute intracranial abnormality. 2. Mild chronic small vessel ischemia. Electronically Signed   By: Andrea Gasman M.D.   On: 03/30/2024 20:24     Procedures   Medications Ordered in the ED  sodium chloride flush (NS) 0.9 % injection 3 mL (0 mLs Intravenous Hold 03/30/24 2106)  meclizine (ANTIVERT) tablet 25 mg (25 mg Oral Given 03/30/24 2155)                                    Medical Decision Making Previously well adult male presents with acute onset dizziness, subsequent development of nausea, vomiting.  Patient has no history of intracranial abnormalities, stroke, vertigo.  Presentation concerning for vertigo versus stroke less likely given the onset with head motion, dizziness without focal neurodeficits.  Patient is mildly hypertensive, hypertensive emergency considered as well. Cardiac 80 sinus normal pulse ox 99% room air normal  Amount and/or Complexity of Data Reviewed Independent Historian: friend Labs: ordered. Decision-making details documented in ED Course. Radiology: ordered and independent interpretation performed. Decision-making details documented in ED Course. ECG/medicine tests: ordered and independent interpretation performed. Decision-making details documented in ED Course.  Risk Prescription drug management. Decision regarding hospitalization. Diagnosis or treatment significantly limited by social determinants of health.   10:30 PM Patient awake and alert, feeling better,  corroborated by his girlfriend. We discussed all findings and I reviewed them.  No evidence for hemorrhage, mass, infection, bacteremia, sepsis.  Considerations remaining included vertigo, less likely stroke given reassuring neuroexam, less likely arrhythmia as he has been monitored for hours without decompensation.  We discussed these latter 2 possibilities, patient is to follow-up with what is in affect his primary care physician, his cardiologist.  Patient will start meclizine.  We discussed return precautions, follow-up instructions at length.     Final diagnoses:  Dizziness    ED Discharge Orders          Ordered    meclizine (ANTIVERT) 12.5 MG tablet  3 times daily        03/30/24 2230               Garrick Charleston, MD 03/30/24 2231

## 2024-03-30 NOTE — Discharge Instructions (Addendum)
 Today's evaluation has been generally reassuring.  You may have experienced an episode of vertigo causing your dizziness, nausea, and unsteadiness.  Is important to monitor your condition carefully and follow-up with your physician.  Do not hesitate to return here for any concerning changes in your condition.

## 2024-03-31 ENCOUNTER — Encounter: Payer: Self-pay | Admitting: Internal Medicine
